# Patient Record
Sex: Female | Born: 1965 | Race: White | Hispanic: Yes | Marital: Married | Smoking: Never smoker
Health system: Southern US, Community
[De-identification: ages and names within clinical notes are randomized; demographics above are authoritative.]

## PROBLEM LIST (undated history)

## (undated) DIAGNOSIS — R7989 Other specified abnormal findings of blood chemistry: Secondary | ICD-10-CM

## (undated) DIAGNOSIS — R42 Dizziness and giddiness: Secondary | ICD-10-CM

## (undated) DIAGNOSIS — Z8619 Personal history of other infectious and parasitic diseases: Secondary | ICD-10-CM

## (undated) DIAGNOSIS — L989 Disorder of the skin and subcutaneous tissue, unspecified: Secondary | ICD-10-CM

## (undated) DIAGNOSIS — J302 Other seasonal allergic rhinitis: Secondary | ICD-10-CM

## (undated) DIAGNOSIS — R945 Abnormal results of liver function studies: Secondary | ICD-10-CM

## (undated) HISTORY — DX: Other seasonal allergic rhinitis: J30.2

## (undated) HISTORY — PX: PELVIC LAPAROSCOPY: SHX162

## (undated) HISTORY — DX: Disorder of the skin and subcutaneous tissue, unspecified: L98.9

## (undated) HISTORY — PX: GASTRIC BYPASS: SHX52

## (undated) HISTORY — DX: Abnormal results of liver function studies: R94.5

## (undated) HISTORY — DX: Dizziness and giddiness: R42

## (undated) HISTORY — DX: Personal history of other infectious and parasitic diseases: Z86.19

## (undated) HISTORY — DX: Morbid (severe) obesity due to excess calories: E66.01

## (undated) HISTORY — PX: OVARIAN CYST REMOVAL: SHX89

## (undated) HISTORY — PX: HERNIA REPAIR: SHX51

## (undated) HISTORY — DX: Other specified abnormal findings of blood chemistry: R79.89

---

## 1999-09-19 ENCOUNTER — Encounter (INDEPENDENT_AMBULATORY_CARE_PROVIDER_SITE_OTHER): Payer: Self-pay | Admitting: *Deleted

## 1999-09-19 LAB — CONVERTED CEMR LAB

## 1999-11-17 ENCOUNTER — Encounter: Admission: RE | Admit: 1999-11-17 | Discharge: 1999-11-17 | Payer: Self-pay | Admitting: Obstetrics

## 1999-11-24 ENCOUNTER — Ambulatory Visit (HOSPITAL_COMMUNITY): Admission: RE | Admit: 1999-11-24 | Discharge: 1999-11-24 | Payer: Self-pay | Admitting: Obstetrics

## 1999-11-24 ENCOUNTER — Encounter: Payer: Self-pay | Admitting: Obstetrics

## 1999-12-02 ENCOUNTER — Inpatient Hospital Stay (HOSPITAL_COMMUNITY): Admission: AD | Admit: 1999-12-02 | Discharge: 1999-12-02 | Payer: Self-pay | Admitting: Obstetrics & Gynecology

## 1999-12-02 ENCOUNTER — Ambulatory Visit (HOSPITAL_COMMUNITY): Admission: RE | Admit: 1999-12-02 | Discharge: 1999-12-02 | Payer: Self-pay | Admitting: Obstetrics

## 1999-12-15 ENCOUNTER — Encounter: Admission: RE | Admit: 1999-12-15 | Discharge: 1999-12-15 | Payer: Self-pay | Admitting: Obstetrics

## 1999-12-21 ENCOUNTER — Encounter: Admission: RE | Admit: 1999-12-21 | Discharge: 1999-12-21 | Payer: Self-pay | Admitting: Obstetrics

## 2000-04-02 ENCOUNTER — Encounter: Admission: RE | Admit: 2000-04-02 | Discharge: 2000-04-02 | Payer: Self-pay | Admitting: Family Medicine

## 2000-04-16 ENCOUNTER — Encounter: Admission: RE | Admit: 2000-04-16 | Discharge: 2000-04-16 | Payer: Self-pay | Admitting: Family Medicine

## 2000-05-29 ENCOUNTER — Encounter: Admission: RE | Admit: 2000-05-29 | Discharge: 2000-05-29 | Payer: Self-pay | Admitting: Family Medicine

## 2000-09-18 HISTORY — PX: INTRAUTERINE DEVICE INSERTION: SHX323

## 2000-12-10 ENCOUNTER — Encounter (INDEPENDENT_AMBULATORY_CARE_PROVIDER_SITE_OTHER): Payer: Self-pay | Admitting: Specialist

## 2000-12-10 ENCOUNTER — Ambulatory Visit (HOSPITAL_COMMUNITY): Admission: RE | Admit: 2000-12-10 | Discharge: 2000-12-10 | Payer: Self-pay | Admitting: Obstetrics and Gynecology

## 2001-03-26 ENCOUNTER — Other Ambulatory Visit: Admission: RE | Admit: 2001-03-26 | Discharge: 2001-03-26 | Payer: Self-pay | Admitting: Gynecology

## 2005-04-25 ENCOUNTER — Other Ambulatory Visit: Admission: RE | Admit: 2005-04-25 | Discharge: 2005-04-25 | Payer: Self-pay | Admitting: Family Medicine

## 2005-05-09 ENCOUNTER — Ambulatory Visit (HOSPITAL_COMMUNITY): Admission: RE | Admit: 2005-05-09 | Discharge: 2005-05-09 | Payer: Self-pay | Admitting: Gynecology

## 2006-04-30 ENCOUNTER — Other Ambulatory Visit: Admission: RE | Admit: 2006-04-30 | Discharge: 2006-04-30 | Payer: Self-pay | Admitting: Gynecology

## 2006-07-26 ENCOUNTER — Ambulatory Visit: Payer: Self-pay | Admitting: Family Medicine

## 2006-07-26 ENCOUNTER — Ambulatory Visit: Payer: Self-pay

## 2006-07-26 LAB — CONVERTED CEMR LAB
Chol/HDL Ratio, serum: 4.7
Cholesterol: 217 mg/dL (ref 0–200)
Glucose, Bld: 95 mg/dL (ref 70–99)
HDL: 45.7 mg/dL (ref >39.0)
LDL DIRECT: 139 mg/dL
Triglyceride fasting, serum: 135 mg/dL (ref 0–149)
VLDL: 27 mg/dL (ref 0–40)

## 2006-11-16 ENCOUNTER — Encounter (INDEPENDENT_AMBULATORY_CARE_PROVIDER_SITE_OTHER): Payer: Self-pay | Admitting: *Deleted

## 2007-05-09 ENCOUNTER — Other Ambulatory Visit: Admission: RE | Admit: 2007-05-09 | Discharge: 2007-05-09 | Payer: Self-pay | Admitting: Gynecology

## 2007-08-29 ENCOUNTER — Ambulatory Visit: Payer: Self-pay | Admitting: Family Medicine

## 2007-08-29 DIAGNOSIS — M79609 Pain in unspecified limb: Secondary | ICD-10-CM | POA: Insufficient documentation

## 2007-09-06 ENCOUNTER — Encounter (INDEPENDENT_AMBULATORY_CARE_PROVIDER_SITE_OTHER): Payer: Self-pay | Admitting: *Deleted

## 2011-02-03 NOTE — Op Note (Signed)
[facility]  Patient:    Jasmine Campbell, Jasmine Campbell                   MRN: [id number] Proc. Date: 12/10/00 Adm. Date:  [id number] Attending:  Tonye Royalty                           Operative Report  SURGEON:                      Gaetano Hawthorne. Lily Peer, M.D.  ASSISTANT:                    Katy Fitch, M.D.  INDICATIONS:                  This 45 year old gravida 1, para 1, with chronic right lower quadrant pain and persistent right ovarian cyst.  PREOPERATIVE DIAGNOSES:       1. Chronic pelvic pain.                               2. Persistent right ovarian cyst.  POSTOPERATIVE DIAGNOSES:      1. Chronic pelvic pain.                               2. Persistent right ovarian cyst.  ANESTHESIA:                   General endotracheal anesthesia.  PROCEDURE PERFORMED:          Laparoscopic right ovarian cystectomy.  FINDINGS:                     1. A 3.0 cm x 4.0 cm smooth-surfaced ovarian                               2. The contralateral ovary was normal.                               3. Normal tubes bilateral, and cul-de-sac                                  negative for endometriosis or adhesions.  DESCRIPTION OF PROCEDURE:     After the patient was adequately counseled, she was taken to the operating room where she had received 1 g of Cefotan for prophylaxis preoperatively.  She was placed in the low lithotomy position. The abdomen, vagina, and perineum were prepped and draped in the usual sterile fashion.  A Foley catheter was inserted.  A Hulka tenaculum was placed.  Keep in mind that there was an IUD still present, and was able to be introduced without any complication.  The drapes were then in place.  A small stab incision was made underneath the umbilicus, and with light blunt dissection the peritoneal cavity was entered.  Then the 10/11 trocar was inserted under visual inspection.  The CO2 was then connected, and the abdomen was  inflated to 3.5 L of carbon dioxide.  Two additional ports and 5 mm trocar were inserted approximately four finger breadths for the midline in the lower abdomen under laparoscopic visualization.  After the systematic inspection of the entire pelvic cavity, not demonstrating any other abnormality except for the above-mentioned cyst, a long needle was inserted and the ovarian cyst was aspirated and the cyst collapse.  The fluid was submitted for cytological evaluation, and the cyst wall was incised.  The ovarian capsule was entered, and the capsule was excised.  The ovarian cyst wall was then excised and passed off the operative field.  For hemostasis, some of the edges were cauterized with the bipolar Klepinger forceps.  The pelvic cavity was copiously irrigated with normal saline solution.  The instruments were removed under laparoscopic evaluation and under laparoscopic guidance, and no abnormality was noted.  The carbon dioxide was removed from the abdominal cavity.  The rectus fascia underneath the umbilicus was reapproximated with a running stitch of #3-0 Vicryl suture.  The skin was reapproximated with a subcuticular stitch of #4-0 plain catgut suture.  The two 5 mm trocars were reapproximated with interrupted sutures of #4-0 plain catgut suture for postoperative analgesia.  Marcaine 0.25% for a total of 10 cc was infiltrated in all three incision sites.  The Hulka tenaculum was removed, and the patient was awakened and transferred to the recovery room with stable vital signs. She received 30 mg of Toradol IV en route to the recovery room.  The blood loss was minimal.  The fluid resuscitation consisted of 1900 cc of lactated Ringers.  Urine output was 225 cc and clear.  Blood loss was minimal.   cyst. DD:  12/10/00 TD:  12/11/00 Job: [id number]

## 2011-02-03 NOTE — H&P (Signed)
[facility]  Patient:    Jasmine Campbell, Jasmine Campbell                   MRN: [id number] Adm. Date:  12/10/00 Attending:  Gaetano Hawthorne. Lily Peer, M.D.                         History and Physical  CHIEF COMPLAINT:              1. Chronic right lower quadrant pain.                               2. Persistent right ovarian cyst.  HISTORY OF PRESENT ILLNESS:   The patient is a 45 year old, gravida 1, para 1, with an IUD placed approximately four years ago in [place].  The patient was seen in our [facility] last March and was found to have a right ovarian cyst and was seen in the office the latter part of last year and was offered suppressive therapy versus proceeding with laparoscopic procedure, and they opted for Depo-Provera, so she received 150 mg IM and she returned back to the office on February 21 for followup ultrasound.  There was persistent right ovarian cyst that measured 4.4 x 4.0 x 3.1 cm.  The left ovary was normal and there was no fluid in the cul-de-sac.  The uterus was normal with an IUD found in normal position.  The patients work-up has included CA-125 which was normal.  She is being treated on a long-term treatment with Terazol for chronic moniliasis.  Due to some waking she had experienced in the past a random blood sugar was done in November of last year which had been reported to be normal.  She has also had a TSH which was also normal.  PAST MEDICAL HISTORY:         The patients mother is insulin-dependent diabetic and also hypertensive.  The patient with IUD as a form of contraception.  She has had one previous cesarean section.  PAST SURGICAL HISTORY:        She says she had some cyst removed from her ovaries in [place] in 1989.  She stated this was done at the time of her C-section, ?.  ALLERGIES:                    She denies any allergies.  CURRENT MEDICATIONS:          Terazol vaginal cream that she applies weekly for  chronic moniliasis.  PHYSICAL EXAMINATION:  GENERAL:                      Well-developed, well-nourished female with a weight of 182 pounds, 5 feet 2-1/2 inches tall.  HEENT:                        Unremarkable.  NECK:                         Supple.  Trachea midline.  No carotid bruits, no thyromegaly.  LUNGS:                        Clear to auscultation without rhonchi or wheezes.  HEART:  Regular rate and rhythm, no murmurs or gallops.  BREASTS:                      Breast examination done.  ABDOMEN:                      Soft, some tenderness in the right lower quadrant but no rebound or guarding.  PELVIC:                       Bartholins, urethral, and Skenes was within normal limits.  Vagina, cervix, no gross lesions on inspection.  IUD string visualized.  Uterus anteverted, upper limits of normal.  Adnexa:  Some fullness and slight tenderness right adnexa, left adnexa normal.  Rectal examination confirmed.  ASSESSMENT:                   A 45 year old, gravida 1, para 1 female with an intrauterine device in place with chronic right lower quadrant pain and persistent right ovarian cyst is scheduled to undergo diagnostic laparoscopy and right ovarian cystectomy, possible right oophorectomy, possible right salpingo-oophorectomy today at 1 oclock on December 10, 2000.  The risks and benefits of the procedure were explained to the patient and husband in Spanish to include infection, bleeding, trauma to internal organ, difficulty entrance to the abdominal cavity requiring open laparotomy and obtaining access to the pelvic cavity through that route which would require the patient to be hospitalized for several days, so she will receive antibiotics prophylaxis in the event of any hemorrhage.  She is aware that blood transfusions carry risks such as antephialtic reaction, hepatitis, and AIDS.  All these issues were discussed with the patient and her husband.   All questions were answered and we will follow accordingly.  PLAN:                         The patient is scheduled of diagnostic laparoscopy and right ovarian cystectomy for pelvic pain today March 25 at 1 p.m. at [facility].  Please have history and physical available. DD:  12/10/00 TD:  12/10/00 Job: [id number]

## 2011-07-03 ENCOUNTER — Encounter: Payer: Self-pay | Admitting: Anesthesiology

## 2011-07-06 ENCOUNTER — Ambulatory Visit: Payer: Self-pay | Admitting: Gynecology

## 2011-07-24 ENCOUNTER — Other Ambulatory Visit: Payer: Self-pay | Admitting: Gynecology

## 2011-07-24 ENCOUNTER — Ambulatory Visit (INDEPENDENT_AMBULATORY_CARE_PROVIDER_SITE_OTHER): Payer: 59 | Admitting: Gynecology

## 2011-07-24 ENCOUNTER — Encounter: Payer: Self-pay | Admitting: Gynecology

## 2011-07-24 ENCOUNTER — Other Ambulatory Visit (HOSPITAL_COMMUNITY)
Admission: RE | Admit: 2011-07-24 | Discharge: 2011-07-24 | Disposition: A | Discharge status: Home / Self Care | Payer: 59 | Source: Ambulatory Visit | Attending: Gynecology | Admitting: Gynecology

## 2011-07-24 VITALS — BP 128/82 | Ht 63.0 in | Wt 185.0 lb

## 2011-07-24 DIAGNOSIS — Z01419 Encounter for gynecological examination (general) (routine) without abnormal findings: Secondary | ICD-10-CM | POA: Insufficient documentation

## 2011-07-24 DIAGNOSIS — Z975 Presence of (intrauterine) contraceptive device: Secondary | ICD-10-CM

## 2011-07-24 DIAGNOSIS — R635 Abnormal weight gain: Secondary | ICD-10-CM

## 2011-07-24 DIAGNOSIS — Z1231 Encounter for screening mammogram for malignant neoplasm of breast: Secondary | ICD-10-CM

## 2011-07-24 DIAGNOSIS — R823 Hemoglobinuria: Secondary | ICD-10-CM

## 2011-07-24 DIAGNOSIS — Z833 Family history of diabetes mellitus: Secondary | ICD-10-CM

## 2011-07-24 DIAGNOSIS — IMO0001 Reserved for inherently not codable concepts without codable children: Secondary | ICD-10-CM

## 2011-07-24 LAB — HEMOGLOBIN A1C: Mean Plasma Glucose: 111 mg/dL (ref <117)

## 2011-07-24 NOTE — Progress Notes (Signed)
Jasmine Campbell [birth year] [id number]  Patient is a 45 year old gravida 1 para 1 who has not been seen in the office is 2008. Her last mammogram was in 2006 which was normal and she frequently does result this examination is having normal menstrual cycles and is overdue to have the ParaGard T380A IUD removed. She does have a strong family history of diabetes and is overweight. Patient is having normal menstrual cycles.   Past medical history,surgical history, family history and social history were all reviewed and documented in the EPIC chart.  Gynecologic History Patient's last menstrual period was 07/22/2011. Contraception: IUD Last Pap: 2008. Results were: normal Last mammogram: 2006. Results were:normal}  Obstetric History OB History    Grav Para Term Preterm Abortions TAB SAB Ect Mult Living   1 1 1       1      # Outc Date GA Lbr Len/2nd Wgt Sex Del Anes PTL Lv   1 TRM     M CS   Yes       ROS:  Was performed and pertinent positives and negatives are included in the history.  Exam: chaperone present  BP 128/82  Ht 5\' 3"  (1.6 m)  Wt 185 lb (83.915 kg)  BMI 32.77 kg/m2  LMP 07/22/2011  Body mass index is 32.77 kg/(m^2).  General appearance : Well developed well nourished female. No acute distress HEENT: Neck supple, trachea midline, no carotid bruits, no thyroidmegaly Lungs: Clear to auscultation, no rhonchi or wheezes, or rib retractions  Heart: Regular rate and rhythm, no murmurs or gallops Breast:Examined in sitting and supine position were symmetrical in appearance, no palpable masses or tenderness,  no skin retraction, no nipple inversion, no nipple discharge, no skin discoloration, no axillary or supraclavicular lymphadenopathy Abdomen: no palpable masses or tenderness, no rebound or guarding Extremities: no edema or skin discoloration or tenderness  Pelvic:  Bartholin, Urethra, Skene Glands: Within normal limits             Vagina: No gross lesions or  discharge  Cervix: No gross lesions or discharge IUD string seen  Uterus  anteverted, normal size, shape and consistency, non-tender and mobile  Adnexa  Without masses or tenderness  Anus and perineum  normal   Rectovaginal  normal sphincter tone without palpated masses or tenderness             Hemoccult not done     Assessment/Plan:  45 y.o. female for annual exam which was unremarkable. Patient was scheduled appointment next week to remove her IUD in place a new ParaGard T380A IUD. The following labs were ordered today CBC, cholesterol, hemoglobin A1c, TSH, urinalysis. We'll discuss the results when she returned to the office next week. Pap smear was done today. She is instructed take calcium and vitamin D for osteoporosis prevention.

## 2011-07-24 NOTE — Patient Instructions (Signed)
Tomar calcium con vitamina D una tableta de Caltrate Plus, o Oscal diaria

## 2011-07-24 NOTE — Progress Notes (Signed)
Addended byCammie Mcgee T on: 07/24/2011 11:49 AM   Modules accepted: Orders

## 2011-07-25 ENCOUNTER — Telehealth: Payer: Self-pay | Admitting: *Deleted

## 2011-07-25 NOTE — Telephone Encounter (Signed)
Message copied by Libby Maw on Tue Jul 25, 2011  9:18 AM ------      Message from: Reynaldo Minium H      Created: Mon Jul 24, 2011 11:31 AM       Ladene Allocca, please order the ParaGard T380A IUD for this patient. She has been an appointment to see me next week to remove her old 1 so we can put in a new one. Please call her to confirm that is covered by her insurance and her days been scheduled for next week.

## 2011-07-25 NOTE — Telephone Encounter (Signed)
Patient was informed by [facility] CMA benefits for Paraguard.  Patient would be responsible for full cost of IUD, removal, and insert.  Her insurance has a $1500 deductable that has to be met before it pays 70%.  Her portion would be $1135.00.  Patient told Elane Fritz CMA that she would check further with insurance and if she does not go with IUD, would want to discuss further options with JF.

## 2011-08-01 ENCOUNTER — Ambulatory Visit: Payer: 59 | Admitting: Gynecology

## 2011-08-21 ENCOUNTER — Ambulatory Visit (HOSPITAL_COMMUNITY)
Admission: RE | Admit: 2011-08-21 | Discharge: 2011-08-21 | Disposition: A | Discharge status: Home / Self Care | Payer: 59 | Source: Ambulatory Visit | Attending: Gynecology | Admitting: Gynecology

## 2011-08-21 DIAGNOSIS — Z1231 Encounter for screening mammogram for malignant neoplasm of breast: Secondary | ICD-10-CM | POA: Insufficient documentation

## 2012-05-04 ENCOUNTER — Ambulatory Visit (INDEPENDENT_AMBULATORY_CARE_PROVIDER_SITE_OTHER): Payer: 59 | Admitting: Internal Medicine

## 2012-05-04 VITALS — BP 125/82 | HR 96 | Temp 98.5°F | Resp 18 | Ht 63.0 in | Wt 188.6 lb

## 2012-05-04 DIAGNOSIS — J40 Bronchitis, not specified as acute or chronic: Secondary | ICD-10-CM

## 2012-05-04 MED ORDER — HYDROCODONE-ACETAMINOPHEN 7.5-500 MG/15ML PO SOLN: 5.0000 mL | Freq: Four times a day (QID) | ORAL | Status: AC | PRN

## 2012-05-04 MED ORDER — AZITHROMYCIN 500 MG PO TABS: 500.0000 mg | ORAL_TABLET | Freq: Every day | ORAL | Status: AC

## 2012-05-04 NOTE — Progress Notes (Signed)
  Subjective:    Patient ID: Jasmine Campbell, female    DOB: June 27, 1966, 46 y.o.   MRN: [id number]  HPI Has cough and st, no sob, cp. General health is good.   Review of Systems     Objective:   Physical Exam  Constitutional: She is oriented to person, place, and time. She appears well-developed and well-nourished.  HENT:  Right Ear: External ear normal.  Left Ear: External ear normal.  Mouth/Throat: Oropharyngeal exudate present.  Eyes: EOM are normal. Pupils are equal, round, and reactive to light.  Neck: Normal range of motion. Neck supple.  Cardiovascular: Normal rate, regular rhythm and normal heart sounds.   Pulmonary/Chest: Effort normal. Not tachypneic. No respiratory distress. She has no decreased breath sounds. She has rhonchi.  Lymphadenopathy:    She has cervical adenopathy.  Neurological: She is alert and oriented to person, place, and time.  Skin: Skin is warm and dry.  Psychiatric: She has a normal mood and affect.          Assessment & Plan:  Bronchitis

## 2012-05-04 NOTE — Patient Instructions (Addendum)
Faringitis (viral y [place]) (Pharyngitis, Viral and Bacterial) Es una inflamacin (irritacin) o infeccin de la faringe. Tambin se denomina dolor de garganta.  CAUSAS La mayor parte de las anginas son causadas por virus y son parte de un resfro. Sin embargo, algunas anginas son ocasionadas por la bacteria estreptococo (germen) o por otras bacterias. La causa de la angina tambin puede ser el goteo nasal proveniente de los senos [place], [place] y, en algunos [place], se produce incluso por dormir con la boca abierta. Las anginas infecciosas pueden contagiarse de [place]om[place] Dear persona a otra por la tos, el estornudo y por compartir tasas o cubiertos. TRATAMIENTO Las anginas de causa viral generalmente duran entre 3 y [address] Estas infecciones virales generalmente mejoran sin antibiticos (medicamentos que destruyen grmenes). Las anginas por estreptococo y otras bacterias (grmenes) generalmente mejoran despus de 24 a 48 horas de Microbiologist con antibiticos. INSTRUCCIONES PARA EL CUIDADO DOMICILIARIO  Si en profesional considera que se trata de una infeccin bacteriana o si hay una prueba positiva para Event organiser, Administrator, sports un antibitico. Debe tomar todos los antibiticos Librarian, academic. Si no completa el tratamiento con antibiticos, usted o su hijo podrn enfermar nuevamente. Si usted o su hijo presentan un estreptococo en la garganta y no completan el tratamiento, podran sufrir un trastorno grave en el corazn o en los riones.   Beba gran cantidad de lquidos. Alrededor de 8-10 vasos grandes de agua por da. (Puede ser agua, jugos, licuados de frutas, Kool-aid, [place], [place], etc.).   Utilice los medicamentos de venta libre o de prescripcin para Chief Technology Officer, Environmental health practitioner o la [place], segn se lo indique el profesional que lo asiste.   Descanse lo suficiente.   Hgase grgaras con agua salada (1/2 cucharadita de sal en un vaso de agua) cada 1-2 horas si  lo necesita para [employer].   Si el paciente es mayor de [place], ofrzcale caramelos duros o Engineer, manufacturing o pastillas para la tos.  SOLICITE ATENCIN MDICA SI:  Si le aparecen bultos grandes y dolorosos en el cuello.   Presenta una erupcin.   Elimina un esputo verde, marrn-amarillento o sanguinolento.   El beb tiene ms de 3 meses y su temperatura rectal es de 100.5 F (38.1 C) o ms durante ms de 1 da.  SOLICITE ATENCIN MDICA DE INMEDIATO SI:  Siente rigidez en el cuello.   Usted o su hijo babean o no pueden tragar lquidos.   Usted o su hijo vomitan, no pueden retener los [employer].   Usted o su hijo presentan dolor intenso, que no se alivia con los [employer] Health han recetado.   Usted o su hijo presentan dificultad para respirar (y no se debe a la [place]).   Usted o su hijo no pueden abrir Scientist, product/process development.   Usted o su nio presentan aumento del dolor, hinchazn, enrojecimiento en el cuello.   Tiene fiebre.   Su beb tiene ms de 3 meses y su temperatura rectal es de 102 F (38.9 C) o ms.   Su beb tiene 3 meses o menos y su temperatura rectal es de 100.4 F (38 C) o ms.  EST SEGURO QUE:   Comprende las instrucciones para el alta mdica.   Controlar su enfermedad.   Solicitar atencin mdica de inmediato segn las indicaciones.  Document Released: 06/14/2005 Document Revised: 08/24/2011 [facility] Patient Information 2012 [place], [place].Bronquitis (Bronchitis) La bronquitis es [place] inflamacin (el modo que tiene [place]  organismo de Publishing rights manager a una lesin o infeccin) de los bronquios Los bronquios son los conductos que se extienden desde la trquea [employer]. Si la inflamacin se agrava, puede causar la falta de aire. CAUSAS Las causas de la inflamacin pueden ser:  Un virus   Grmenes (bacteria).   Polvo   Alergenos   La polucin y muchos otros irritantes  Las clulas que revisten el  rbol bronquial estn cubiertas con pequeos pelos (cilias). Esta constantemente producen un movimiento desde los pulmones hacia la boca. De este modo se mantienen los pulmones libres de polucin. Cuando estas clulas se irritan y no pueden cumplir su funcin, comienza a formarse la mucosidad. Esto produce la caracterstica tos de la bronquitis. La tos es el mecanismo por el cual se limpian los pulmones cuando las cilias no pueden cumplir su funcin. Sin alguno de [employer], Animator se Psychologist, clinical los pulmones Entonces se desarrollara una pulmona.  El fumar es una de las causas ms frecuentes de bronquitis y puede contribuir a la neumona. Abandonar este hbito es lo ms importante que puede hacer para beneficiarse. TRATAMIENTO  El [employer] prescribir antibiticos si la causa es una bacteria, y medicamentos para abrir las vas areas y Personnel officer. Tambin puede recomendar o prescribir un expectorante. El expectorante aflojar la mucosidad para que pueda eliminarla. Slo tome medicamentos de Sales promotion account executive o prescriptos para Primary school teacher, las [place], o bajar la fiebre segn las indicaciones de su mdico.   Pharmacologist todo lo que causa el problema (por ejemplo el hbito de Art therapist) es fundamental para evitar que empeore.   Un antitusgeno puede prescribirse para [employer] de la tos.   Podrn indicarle inhalantes para aliviar los sntomas actuales y ayudar a prevenir problemas futuros.   Aquellos que sufren bronquitis crnica (recurrente) puede ser necesaria la administracin de corticoides.  SOLICITE ATENCIN MDICA INMEDIATAMENTE SI:  Durante el tratamiento observa que elimina esputo similar a pus (purulento).   Tiene fiebre.   Su beb tiene ms de 3 meses y su temperatura rectal es de 102 F (38.9 C) o ms.   Su beb tiene 3 meses o menos y su temperatura rectal es de 100.4 F (38 C) o ms.   Se siente cada vez ms enfermo.   Tiene cada  vez ms dificultad para respirar, tiene ruidos al respirar o Company secretary.  Es necesario buscar atencin mdica inmediata si es [place] persona de edad avanzada o sufre alguna otra enfermedad. ASEGURESE DE QUE:   Comprende estas instrucciones.   Controlar su enfermedad.   Solicitar ayuda inmediatamente si no mejora o si empeora.  Document Released: 09/04/2005 Document Revised: 08/24/2011 [facility] Patient Information 2012 [place], [place].

## 2012-07-26 ENCOUNTER — Encounter: Payer: Self-pay | Admitting: Family Medicine

## 2012-07-26 ENCOUNTER — Ambulatory Visit (INDEPENDENT_AMBULATORY_CARE_PROVIDER_SITE_OTHER): Payer: 59 | Admitting: Family Medicine

## 2012-07-26 VITALS — BP 114/68 | HR 75 | Temp 98.3°F | Resp 16 | Ht 63.0 in | Wt 200.6 lb

## 2012-07-26 DIAGNOSIS — Z0000 Encounter for general adult medical examination without abnormal findings: Secondary | ICD-10-CM

## 2012-07-26 LAB — POCT CBC
Granulocyte percent: 64.1 % (ref 37–80)
HCT, POC: 47.3 % (ref 37.7–47.9)
Hemoglobin: 14.9 g/dL (ref 12.2–16.2)
Lymph, poc: 2 (ref 0.6–3.4)
MCH, POC: 28.2 pg (ref 27–31.2)
MCHC: 31.5 g/dL — AB (ref 31.8–35.4)
MCV: 89.4 fL (ref 80–97)
MID (cbc): 0.4 (ref 0–0.9)
MPV: 9 fL (ref 0–99.8)
POC Granulocyte: 4.3 (ref 2–6.9)
POC LYMPH PERCENT: 29.7 %L (ref 10–50)
POC MID %: 6.2 % (ref 0–12)
Platelet Count, POC: 341 10*3/uL (ref 142–424)
RBC: 5.29 M/uL (ref 4.04–5.48)
RDW, POC: 13.8 %
WBC: 6.7 10*3/uL (ref 4.6–10.2)

## 2012-07-26 LAB — POCT URINALYSIS DIPSTICK
Bilirubin, UA: NEGATIVE
Glucose, UA: NEGATIVE
Ketones, UA: NEGATIVE
Nitrite, UA: NEGATIVE
Protein, UA: NEGATIVE
Spec Grav, UA: <=1.005
Urobilinogen, UA: 0.2
pH, UA: 5

## 2012-07-26 LAB — COMPREHENSIVE METABOLIC PANEL WITH GFR
ALT: 71 U/L — ABNORMAL HIGH (ref 0–35)
Albumin: 4.1 g/dL (ref 3.5–5.2)
CO2: 29 meq/L (ref 19–32)
Calcium: 9.6 mg/dL (ref 8.4–10.5)
Chloride: 101 meq/L (ref 96–112)
Glucose, Bld: 74 mg/dL (ref 70–99)
Potassium: 4.2 meq/L (ref 3.5–5.3)
Sodium: 137 meq/L (ref 135–145)
Total Bilirubin: 0.6 mg/dL (ref 0.3–1.2)
Total Protein: 7 g/dL (ref 6.0–8.3)

## 2012-07-26 LAB — COMPREHENSIVE METABOLIC PANEL
AST: 34 U/L (ref 0–37)
Alkaline Phosphatase: 80 U/L (ref 39–117)
BUN: 10 mg/dL (ref 6–23)
Creat: 0.82 mg/dL (ref 0.50–1.10)

## 2012-07-26 LAB — POCT UA - MICROSCOPIC ONLY
Casts, Ur, LPF, POC: NEGATIVE
Crystals, Ur, HPF, POC: NEGATIVE
Mucus, UA: NEGATIVE
Yeast, UA: NEGATIVE

## 2012-07-26 LAB — LIPID PANEL
Cholesterol: 201 mg/dL — ABNORMAL HIGH (ref 0–200)
HDL: 51 mg/dL (ref >39)
LDL Cholesterol: 116 mg/dL — ABNORMAL HIGH (ref 0–99)
Total CHOL/HDL Ratio: 3.9 Ratio
Triglycerides: 171 mg/dL — ABNORMAL HIGH (ref <150)
VLDL: 34 mg/dL (ref 0–40)

## 2012-07-26 LAB — TSH: TSH: 2.886 u[IU]/mL (ref 0.350–4.500)

## 2012-07-26 LAB — POCT GLYCOSYLATED HEMOGLOBIN (HGB A1C): Hemoglobin A1C: 5.7

## 2012-07-26 NOTE — Progress Notes (Signed)
[facility]:  Office Visit  Chief Complaint:  Chief Complaint  Patient presents with  . Annual Exam    for work    HPI: Jasmine Campbell is a 46 y.o. female who complains of  Annual without pap. She has been doing well, has had no abnormal paps or mammogram, however she does not get one every year, last mammo and pap were in 07/24/2011 which were both normal. She is here today for a physical through work. 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 She has been gaining weight but thinks it has to do with her diet. She is drinking vegetable shakes in the morning but not really changing the way she eats or exercises.   History reviewed. No pertinent past medical history. Past Surgical History  Procedure Date  . Cesarean section     OVARIAN CYSTECTOMY  . Pelvic laparoscopy     RIGHT OVARIAN CYSTECTOMY  . Intrauterine device insertion 2002    PARAGUARD T-380   History   Social History  . Marital Status: Legally Separated    Spouse Name: N/A    Number of Children: N/A  . Years of Education: N/A   Social History Main Topics  . Smoking status: Never Smoker   . Smokeless tobacco: Never Used  . Alcohol Use: No     Comment: occasional  . Drug Use: No  . Sexually Active: Yes -- Female partner(s)    Birth Control/ Protection: IUD   Other Topics Concern  . None   Social History Narrative  . None   Family History  Problem Relation Age of Onset  . Diabetes Mother     DIAGNOSED ON 2005  . Hypertension Mother    No Known Allergies Prior to Admission medications   Medication Sig Start Date End Date Taking? Authorizing Provider  Multiple Vitamin (MULTIVITAMIN) capsule Take 1 capsule by mouth daily.     Yes Historical Provider, MD     ROS: The patient denies fevers, chills, night sweats, unintentional weight loss, chest pain, palpitations, wheezing, dyspnea on exertion, nausea, vomiting, abdominal pain, dysuria, hematuria, melena, numbness,  weakness, or tingling.   All other systems have been reviewed and were otherwise negative with the exception of those mentioned in the HPI and as above.    PHYSICAL EXAM: Filed Vitals:   07/26/12 1034  BP: 114/68  Pulse: 75  Temp: 98.3 F (36.8 C)  Resp: 16   Filed Vitals:   07/26/12 1034  Height: 5\' 3"  (1.6 m)  Weight: 200 lb 9.6 oz (90.992 kg)   Body mass index is 35.53 kg/(m^2).  General: Alert, no acute distress, obese HEENT:  Normocephalic, atraumatic, oropharynx patent. EOMI, PERRLA, fundoscopic exam nl Cardiovascular:  Regular rate and rhythm, no rubs murmurs or gallops.  No Carotid bruits, radial pulse intact. No pedal edema.  Respiratory: Clear to auscultation bilaterally.  No wheezes, rales, or rhonchi.  No cyanosis, no use of accessory musculature GI: No organomegaly, abdomen is soft and non-tender, positive bowel sounds.  No masses. Skin: No rashes. Neurologic: Facial musculature symmetric. Psychiatric: Patient is appropriate throughout our interaction. Lymphatic: No cervical lymphadenopathy Musculoskeletal: Gait intact.   LABS: Results for orders placed in visit on 07/24/11  HEMOGLOBIN A1C      Component Value Range   Hemoglobin A1C 5.5  <5.7 %   Mean Plasma Glucose 111  <117 mg/dL     EKG/XRAY:   Primary read interpreted by [facility]. Conley Rolls at [facility].   ASSESSMENT/PLAN:  Encounter Diagnosis  Name Primary?  . Annual physical exam Yes   Patient doing well overall Will return to get labs at 3 pm Labs pending: CBC, UA, CMP, fasting lipid, TSH, HbA1C     Russell Quinney PHUONG, DO 07/26/2012 11:04 AM

## 2012-08-13 ENCOUNTER — Encounter: Payer: Self-pay | Admitting: Family Medicine

## 2012-09-20 ENCOUNTER — Ambulatory Visit (INDEPENDENT_AMBULATORY_CARE_PROVIDER_SITE_OTHER): Payer: 59 | Admitting: Physician Assistant

## 2012-09-20 VITALS — BP 118/78 | HR 78 | Temp 98.1°F | Resp 17 | Ht 63.75 in | Wt 201.2 lb

## 2012-09-20 DIAGNOSIS — J329 Chronic sinusitis, unspecified: Secondary | ICD-10-CM

## 2012-09-20 DIAGNOSIS — R300 Dysuria: Secondary | ICD-10-CM

## 2012-09-20 DIAGNOSIS — R05 Cough: Secondary | ICD-10-CM

## 2012-09-20 LAB — POCT URINALYSIS DIPSTICK
Bilirubin, UA: NEGATIVE
Leukocytes, UA: NEGATIVE
Nitrite, UA: NEGATIVE
Protein, UA: NEGATIVE
Urobilinogen, UA: 0.2
pH, UA: 5.5

## 2012-09-20 LAB — POCT UA - MICROSCOPIC ONLY
Casts, Ur, LPF, POC: NEGATIVE
WBC, Ur, HPF, POC: NEGATIVE
Yeast, UA: NEGATIVE

## 2012-09-20 MED ORDER — IPRATROPIUM BROMIDE 0.03 % NA SOLN: 2.0000 | Freq: Two times a day (BID) | NASAL | Status: DC

## 2012-09-20 MED ORDER — GUAIFENESIN ER 1200 MG PO TB12: 1.0000 | ORAL_TABLET | Freq: Two times a day (BID) | ORAL | Status: DC | PRN

## 2012-09-20 MED ORDER — HYDROCOD POLST-CHLORPHEN POLST 10-8 MG/5ML PO LQCR: 5.0000 mL | Freq: Two times a day (BID) | ORAL | Status: DC | PRN

## 2012-09-20 MED ORDER — AMOXICILLIN-POT CLAVULANATE 875-125 MG PO TABS: 1.0000 | ORAL_TABLET | Freq: Two times a day (BID) | ORAL | Status: DC

## 2012-09-20 NOTE — Patient Instructions (Signed)
Get plenty of rest and drink at least 64 ounces of water daily.  If the discomfort when you urinate continues, please return for additional evaluation.

## 2012-09-20 NOTE — Progress Notes (Signed)
Subjective:    Patient ID: Jasmine Campbell, female    DOB: [birth year], 47 y.o.   MRN: [id number]  HPI This 47 y.o. female presents for evaluation of 2 days of illness.  She describes a cough that causes pain in her chest and urine leakage, LEFT-sided HA and lots of nasal congestion.  Her throat is scratchy.  No fever, chills.  No nausea, vomiting.  Some diarrhea yesterday.  No myalgias/arthralgias.  She complains of burning with urination, but no urgency or frequency.   History reviewed. No pertinent past medical history.  Past Surgical History  Procedure Date  . Cesarean section     OVARIAN CYSTECTOMY  . Pelvic laparoscopy     RIGHT OVARIAN CYSTECTOMY  . Intrauterine device insertion 2002    PARAGUARD T-380    Prior to Admission medications   Medication Sig Start Date End Date Taking? Authorizing Provider  Multiple Vitamin (MULTIVITAMIN) capsule Take 1 capsule by mouth daily.      Historical Provider, MD    No Known Allergies  History   Social History  . Marital Status: Legally Separated    Number of Children: 1  . Years of Education: N/A   Occupational History  . Housekeeping [employer]   Social History Main Topics  . Smoking status: Never Smoker   . Smokeless tobacco: Never Used  . Alcohol Use: No     Comment: occasional  . Drug Use: No  . Sexually Active: Yes -- Female partner(s)    Birth Control/ Protection: IUD   Social History Narrative   Her adult son lives in [place]. She is from [place], came to the [place] in 2000.    Family History  Problem Relation Age of Onset  . Diabetes Mother     DIAGNOSED ON 2005  . Hypertension Mother      Review of Systems As above.    Objective:   Physical Exam Blood pressure 118/78, pulse 78, temperature 98.1 F (36.7 C), temperature source Oral, resp. rate 17, height 5' 3.75" (1.619 m), weight 201 lb 3.2 oz (91.264 kg), last menstrual period 09/13/2012, SpO2 99.00%. Body mass index is 34.81  kg/(m^2). Well-developed, well nourished [place] woman who is awake, alert and oriented, in NAD. HEENT: [place]/AT, PERRL, EOMI.  Sclera and conjunctiva are clear.  EAC are patent, TMs are normal in appearance. Nasal mucosa is very congested, pink and moist. OP is clear. LEFT frontal sinus tenderness, bilateral maxillary sinus tenderness. Neck: supple, non-tender, no lymphadenopathy, thyromegaly. Heart: RRR, no murmur Lungs: normal effort, CTA Extremities: no cyanosis, clubbing or edema. Skin: warm and dry without rash. Psychologic: good mood and appropriate affect, normal speech and behavior.  Results for orders placed in visit on 09/20/12  POCT UA - MICROSCOPIC ONLY      Component Value Range   WBC, Ur, HPF, POC neg     RBC, urine, microscopic 0-1     Bacteria, U Microscopic trace     Mucus, UA neg     Epithelial cells, urine per micros 3-5     Crystals, Ur, HPF, POC neg     Casts, Ur, LPF, POC neg     Yeast, UA neg    POCT URINALYSIS DIPSTICK      Component Value Range   Color, UA yellow     Clarity, UA clear     Glucose, UA neg     Bilirubin, UA neg     Ketones, UA neg     Spec Grav, UA 1.020  Blood, UA neg     pH, UA 5.5     Protein, UA neg     Urobilinogen, UA 0.2     Nitrite, UA neg     Leukocytes, UA Negative        Assessment & Plan:   1. Sinusitis  amoxicillin-clavulanate (AUGMENTIN) 875-125 MG per tablet, ipratropium (ATROVENT) 0.03 % nasal spray, Guaifenesin (MUCINEX MAXIMUM STRENGTH) 1200 MG TB12  2. Cough  chlorpheniramine-HYDROcodone (TUSSIONEX PENNKINETIC ER) 10-8 MG/5ML LQCR  3. Dysuria  POCT UA - Microscopic Only, POCT urinalysis dipstick   Supportive care.  Anticipatory guidance.  RTC if urinary symptoms persist.

## 2013-11-24 ENCOUNTER — Ambulatory Visit (INDEPENDENT_AMBULATORY_CARE_PROVIDER_SITE_OTHER): Payer: 59 | Admitting: Gynecology

## 2013-11-24 ENCOUNTER — Encounter: Payer: Self-pay | Admitting: Gynecology

## 2013-11-24 ENCOUNTER — Other Ambulatory Visit (HOSPITAL_COMMUNITY)
Admission: RE | Admit: 2013-11-24 | Discharge: 2013-11-24 | Disposition: A | Discharge status: Home / Self Care | Payer: 59 | Source: Ambulatory Visit | Attending: Gynecology | Admitting: Gynecology

## 2013-11-24 VITALS — BP 124/80 | Ht 63.0 in | Wt 198.4 lb

## 2013-11-24 DIAGNOSIS — Z833 Family history of diabetes mellitus: Secondary | ICD-10-CM | POA: Insufficient documentation

## 2013-11-24 DIAGNOSIS — Z975 Presence of (intrauterine) contraceptive device: Secondary | ICD-10-CM | POA: Insufficient documentation

## 2013-11-24 DIAGNOSIS — Z01419 Encounter for gynecological examination (general) (routine) without abnormal findings: Secondary | ICD-10-CM | POA: Insufficient documentation

## 2013-11-24 DIAGNOSIS — R635 Abnormal weight gain: Secondary | ICD-10-CM

## 2013-11-24 DIAGNOSIS — K760 Fatty (change of) liver, not elsewhere classified: Secondary | ICD-10-CM | POA: Insufficient documentation

## 2013-11-24 DIAGNOSIS — E785 Hyperlipidemia, unspecified: Secondary | ICD-10-CM

## 2013-11-24 DIAGNOSIS — K7689 Other specified diseases of liver: Secondary | ICD-10-CM

## 2013-11-24 DIAGNOSIS — Z1151 Encounter for screening for human papillomavirus (HPV): Secondary | ICD-10-CM | POA: Insufficient documentation

## 2013-11-24 LAB — COMPREHENSIVE METABOLIC PANEL
ALK PHOS: 81 U/L (ref 39–117)
ALT: 81 U/L — AB (ref 0–35)
AST: 37 U/L (ref 0–37)
Albumin: 4.3 g/dL (ref 3.5–5.2)
BILIRUBIN TOTAL: 0.6 mg/dL (ref 0.2–1.2)
BUN: 12 mg/dL (ref 6–23)
CO2: 27 mEq/L (ref 19–32)
CREATININE: 0.81 mg/dL (ref 0.50–1.10)
Calcium: 9.3 mg/dL (ref 8.4–10.5)
Chloride: 104 mEq/L (ref 96–112)
Glucose, Bld: 110 mg/dL — ABNORMAL HIGH (ref 70–99)
Potassium: 4.4 mEq/L (ref 3.5–5.3)
SODIUM: 140 meq/L (ref 135–145)
TOTAL PROTEIN: 6.7 g/dL (ref 6.0–8.3)

## 2013-11-24 LAB — CBC WITH DIFFERENTIAL/PLATELET
BASOS ABS: 0 10*3/uL (ref 0.0–0.1)
BASOS PCT: 0 % (ref 0–1)
EOS ABS: 0.1 10*3/uL (ref 0.0–0.7)
EOS PCT: 2 % (ref 0–5)
HCT: 44.6 % (ref 36.0–46.0)
Hemoglobin: 15 g/dL (ref 12.0–15.0)
Lymphocytes Relative: 23 % (ref 12–46)
Lymphs Abs: 1.4 10*3/uL (ref 0.7–4.0)
MCH: 28.4 pg (ref 26.0–34.0)
MCHC: 33.6 g/dL (ref 30.0–36.0)
MCV: 84.5 fL (ref 78.0–100.0)
Monocytes Absolute: 0.3 10*3/uL (ref 0.1–1.0)
Monocytes Relative: 5 % (ref 3–12)
NEUTROS PCT: 70 % (ref 43–77)
Neutro Abs: 4.2 10*3/uL (ref 1.7–7.7)
PLATELETS: 290 10*3/uL (ref 150–400)
RBC: 5.28 MIL/uL — ABNORMAL HIGH (ref 3.87–5.11)
RDW: 13.5 % (ref 11.5–15.5)
WBC: 6 10*3/uL (ref 4.0–10.5)

## 2013-11-24 LAB — LIPID PANEL
CHOL/HDL RATIO: 3.9 ratio
Cholesterol: 195 mg/dL (ref 0–200)
HDL: 50 mg/dL (ref >39)
LDL Cholesterol: 99 mg/dL (ref 0–99)
Triglycerides: 228 mg/dL — ABNORMAL HIGH (ref <150)
VLDL: 46 mg/dL — AB (ref 0–40)

## 2013-11-24 LAB — TSH: TSH: 3.444 u[IU]/mL (ref 0.350–4.500)

## 2013-11-24 NOTE — Patient Instructions (Signed)
Control del colesterol  Los niveles de colesterol en el organismo estn determinados significativamente por su dieta. Los niveles de colesterol tambin se relacionan con la enfermedad cardaca. El material que sigue ayuda a explicar esta relacin y a analizar qu puede hacer para mantener su corazn sano. No todo el colesterol es malo. Las lipoprotenas de baja densidad (LDL) forman el colesterol "malo". El colesterol malo puede ocasionar depsitos de grasa que se acumulan en el interior de las arterias. Las lipoprotenas de alta densidad (HDL) es el colesterol "bueno". Ayuda a remover el colesterol LDL "malo" de la sangre. El colesterol es un factor de riesgo muy importante para la enfermedad cardaca. Otros factores de riesgo son la hipertensin arterial, el hbito de fumar, el estrs, la herencia y el peso.   El msculo cardaco obtiene el suministro de sangre a travs de las arterias coronarias. Si su colesterol LDL ("malo") est elevado y el HDL ("bueno") es bajo, tiene un factor de riesgo para que se formen depsitos de grasa en las arterias coronarias (los vasos sanguneos que suministran sangre al corazn). Esto hace que haya menos lugar para que la sangre circule. Sin la suficiente sangre y oxgeno, el msculo cardaco no puede funcionar correctamente, y usted podr sentir dolores en el pecho (angina pectoris). Cuando una arteria coronaria se cierra completamente, una parte del msculo cardaco puede morir (infarto de miocardio).  CONTROL DEL COLESTEROL Cuando el profesional que lo asiste enva la sangre al laboratorio para conocer el nivel de colesterol, puede realizarle tambin un perfil completo de los lpidos. Con esta prueba, se puede determinar la cantidad total de colesterol, as como los niveles de LDL y HDL. Los triglicridos son un tipo de grasa que circula en la sangre y que tambin puede utilizarse para determinar el riesgo de enfermedad  cardaca. En la siguiente tabla se establecen los nmeros ideales: Prueba: Colesterol total  Menos de 200 mg/dl.  Prueba: LDL "colesterol malo"  Menos de 100 mg/dl.   Menos de 70 mg/dl si tiene riesgo muy elevado de sufrir un ataque cardaco o muerte cardaca sbita.  Prueba: HDL "colesterol bueno"  Mujeres: Ms de 50 mg/dl.   Hombres: Ms de 40 mg/dl.  Prueba: Trigliceridos  Menos de 150 mg/dl.    CONTROL DEL COLESTEROL CON DIETA Aunque factores como el ejercicio y el estilo de vida son importantes, la "primera lnea de ataque" es la dieta. Esto se debe a que se sabe que ciertos alimentos hacen subir el colesterol y otros lo bajan. El objetivo debe ser equilibrar los alimentos, de modo que tengan un efecto sobre el colesterol y, an ms importante, reemplazar las grasas saturadas y trans con otros tipos de grasas, como las monoinsaturadas y las poliinsaturadas y cidos grasos omega-3 . En promedio, una persona no debe consumir ms de 15 a 17 g de grasas saturadas por da. Las grasas saturadas y trans se consideran grasas "malas", ya que elevan el colesterol LDL. Las grasas saturadas se encuentran principalmente en productos animales como carne, manteca y crema. Pero esto no significa que usted debe sacrificar todas sus comidas favoritas. Actualmente, como lo muestra el cuadro que figura al final de este documento, hay sustitutos de buen sabor, bajos en grasas y en colesterol, para la mayora de los alimentos que a usted le gusta comer. Elija aquellos alimentos alternativos que sean bajos en grasas o sin grasas. Elija cortes de carne del cuarto trasero o lomo ya que estos cortes son los que tienen menor cantidad de grasa   y colesterol. El pollo (sin piel), el pescado, la carne de ternera, y la pechuga de pavo molida son excelentes opciones. Elimine las carnes grasosas como los hotdogs o el salami. Los mariscos tienen poco o nada de grasas saturadas. Cuando consuma carne magra, carne de aves de  corral, o pescado, hgalo en porciones de 85 gramos (3 onzas). Las grasas trans tambin se llaman "aceites parcialmente hidrogenados". Son aceites manipulados cientficamente de modo que son slidos a temperatura ambiente, tienen una larga vida y mejoran el sabor y la textura de los alimentos a los que se agregan. Las grasas trans se encuentran en la margarina, masitas, crackers y alimentos horneados.  Para hornear y cocinar, el aceite es un excelente sustituto para la mantequilla. Los aceites monoinsaturados tienen un beneficio particular, ya que se cree que disminuyen el colesterol LDL (colesterol malo) y elevan el HDL. Deber evitar los aceites tropicales saturados como el de coco y el de palma.  Recuerde, adems, que puede comer sin restricciones los grupos de alimentos que son naturalmente libres de grasas saturadas y grasas trans, entre los que se incluyen el pescado, las frutas (excepto el aguacate), verduras, frijoles, cereales (cebada, arroz, cuzcuz, trigo) y las pastas (sin salsas con crema)   IDENTIFIQUE LOS ALIMENTOS QUE DISMINUYEN EL COLESTEROL  Pueden disminuir el colesterol las fibras solubles que estn en las frutas, como las manzanas, en los vegetales como el brcoli, las patatas y las zanahorias; en las legumbres como frijoles, guisantes y lentejas; y en los cereales como la cebada. Los alimentos fortificados con fitosteroles tambin pueden disminuir el colesterol. Debe consumir al menos 2 g de estos alimentos a diario para obtener el efecto de disminucin de colesterol.  En el supermercado, lea las etiquetas de los envases para identificar los alimentos bajos en grasas saturadas, libres de grasas trans y bajos en grasas, . Elija quesos que tengan solo de 2 a 3 g de grasa saturada por onza (28,35 g). Use una margarina que no dae el corazn, libre de grasas trans o aceite parcialmente hidrogenado. Al comprar alimentos horneados (galletitas dulces y galletas) evite el aceite parcialmente  hidrogenado. Los panes y bollos debern ser de granos enteros (harina de maz o de avena entera, en lugar de "harina" o "harina enriquecida"). Compre sopas en lata que no sean cremosas, con bajo contenido de sal y sin grasas adicionadas.   TCNICAS DE PREPARACIN DE LOS ALIMENTOS  Nunca fra los alimentos en aceite abundante. Si debe frer, hgalo en poco aceite y removiendo constantemente, porque as se utilizan muy pocas grasas, o utilice un spray antiadherente. Cuando le sea posible, hierva, hornee o ase las carnes y cocine los vegetales al vapor. En vez de aderezar los vegetales con mantequilla o margarina, utilice limn y hierbas, pur de manzanas y canela (para las calabazas y batatas), yogurt y salsa descremados y aderezos para ensaladas bajos en contenido graso.   BAJO EN GRASAS SATURADAS / SUSTITUTOS BAJOS EN GRASA  Carnes / Grasas saturadas (g)  Evite: Bife, corte graso (3 oz/85 g) / 11 g   Elija: Bife, corte magro (3 oz/85 g) / 4 g   Evite: Hamburguesa (3 oz/85 g) / 7 g   Elija:  Hamburguesa magra (3 oz/85 g) / 5 g   Evite: Jamn (3 oz/85 g) / 6 g   Elija:  Jamn magro (3 oz/85 g) / 2.4 g   Evite: Pollo, con piel (3 oz/85 g), Carne oscura / 4 g   Elija:  Pollo, sin piel (  3 oz/85 g), Carne oscura / 2 g   Evite: Pollo, con piel (3 oz/85 g), Carne magra / 2.5 g   Elija: Pollo, sin piel (3 oz/85 g), Carne magra / 1 g  Lcteos / Grasas saturadas (g)  Evite: Leche entera (1 taza) / 5 g   Elija: Leche con bajo contenido de grasa, 2% (1 taza) / 3 g   Elija: Leche con bajo contenido de grasa, 1% (1 taza) / 1.5 g   Elija: Leche descremada (1 taza) / 0.3 g   Evite: Queso duro (1 oz/28 g) / 6 g   Elija: Queso descremado (1 oz/28 g) / 2-3 g   Evite: Queso cottage, 4% grasa (1 taza)/ 6.5 g   Elija: Queso cottage con bajo contenido de grasa, 1% grasa (1 taza)/ 1.5 g   Evite: Helado (1 taza) / 9 g   Elija: Sorbete (1 taza) / 2.5 g   Elija: Yogurt helado sin contenido de  grasa (1 taza) / 0.3 g   Elija: Barras de fruta congeladas / vestigios   Evite: Crema batida (1 cucharada) / 3.5 g   Elija: Batidos glac sin lcteos (1 cucharada) / 1 g  Condimentos / Grasas saturadas (g)  Evite: Mayonesa (1 cucharada) / 2 g   Elija: Mayonesa con bajo contenido de grasa (1 cucharada) / 1 g   Evite: Manteca (1 cucharada) / 7 g   Elija: Margarina extra light (1 cucharada) / 1 g   Evite: Aceite de coco (1 cucharada) / 11.8 g   Elija: Aceite de oliva (1 cucharada) / 1.8 g   Elija: Aceite de maz (1 cucharada) / 1.7 g   Elija: Aceite de crtamo (1 cucharada) / 1.2 g   Elija: Aceite de girasol (1 cucharada) / 1.4 g   Elija: Aceite de soja (1 cucharada) / 2.4 g   Elija: Aceite de canola (1 cucharada) / 1 g  Document Released: 09/04/2005 Document Revised: 05/17/2011 [employer] Patient Information 2012 [employer]. Ejercicios para perder peso (Exercise to Lose Weight) La actividad fsica y una dieta saludable ayudan a perder peso. El mdico podr sugerirle ejercicios especficos. IDEAS Y CONSEJOS PARA HACER EJERCICIOS  Elija opciones econmicas que disfrute hacer , como caminar, andar en bicicleta o los vdeos para ejercitarse.   Utilice las escaleras en lugar del ascensor.   Camine durante la hora del almuerzo.   Estacione el auto lejos del lugar de trabajo o estudio.   Concurra a un gimnasio o tome clases de gimnasia.   Comience con 5  10 minutos de actividad fsica por da. Ejercite hasta 30 minutos, 4 a 6 das por semana.   Utilice zapatos que tengan un buen soporte y ropas cmodas.   Elongue antes y despus de ejercitar.   Ejercite hasta que aumente la respiracin y el corazn palpite rpido.   Beba agua extra cuando ejercite.   No haga ejercicio hasta lastimarse, sentirse mareado o que le falte mucho el aire.  La actividad fsica puede quemar alrededor de 150 caloras.  Correr 20 cuadras en 15 minutos.   Jugar vley durante 45 a 60  minutos.   Limpiar y encerar el auto durante 45 a 60 minutos.   Jugar ftbol americano de toque.   Caminar 25 cuadras en 35 minutos.   Empujar un cochecito 20 cuadras en 30 minutos.   Jugar baloncesto durante 30 minutos.   Rastrillar hojas secas durante 30 minutos.   Andar en bicicleta 80 cuadras en 30 minutos.     Caminar 30 cuadras en 30 minutos.   Bailar durante 30 minutos.   Quitar la nieve con una pala durante 15 minutos.   Nadar vigorosamente durante 20 minutos.   Subir escaleras durante 15 minutos.   Andar en bicicleta 60 cuadras durante 15 minutos.   Arreglar el jardn entre 30 y 45 minutos.   Saltar a la soga durante 15 minutos.   Limpiar vidrios o pisos durante 45 a 60 minutos.  Document Released: 12/09/2010 Document Revised: 05/17/2011 [employer] Patient Information 2012 [employer]. 

## 2013-11-24 NOTE — Progress Notes (Addendum)
Jasmine PandySoledad Campbell Mar 09, 1966 [id number]   History:    48 y.o.  for annual gyn exam who has not been seen in the office is 2012. She states that her primary physician had diagnosed her with fatty liver disease but she has not seen an ovary year. The patient in [place] have her IUD changed. She now has a Mirena IUD that was placed in July of 2012. Patient had an isolated event this past week of feeling dizzy but has not recurred again. No loss of consciousness reported. Her new PCP now is Dr. Luiz Ironabeza which she states she scheduled to see in April who she sees every 6 months but wanted to have her fasting blood work drawn today here.  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  Gynecologic History Patient's last menstrual period was 11/14/2013. Contraception: IUD Last Pap: 2012. Results were: normal Last mammogram: 2014. Results were: Patient states it was normal [place] [facility]  Obstetric History OB History  Gravida Para Term Preterm AB SAB TAB Ectopic Multiple Living  1 1 1       1     # Outcome Date GA Lbr Len/2nd Weight Sex Delivery Anes PTL Lv  1 TRM     M CS   Y       ROS: A ROS was performed and pertinent positives and negatives are included in the history.  GENERAL: No fevers or chills. HEENT: No change in vision, no earache, sore throat or sinus congestion. NECK: No pain or stiffness. CARDIOVASCULAR: No chest pain or pressure. No palpitations. PULMONARY: No shortness of breath, cough or wheeze. GASTROINTESTINAL: No abdominal pain, nausea, vomiting or diarrhea, melena or bright red blood per rectum. GENITOURINARY: No urinary frequency, urgency, hesitancy or dysuria. MUSCULOSKELETAL: No joint or muscle pain, no back pain, no recent  trauma. DERMATOLOGIC: No rash, no itching, no lesions. ENDOCRINE: No polyuria, polydipsia, no heat or cold intolerance. No recent change in weight. HEMATOLOGICAL: No anemia or easy bruising or bleeding. NEUROLOGIC: No headache, seizures, numbness, tingling or weakness. PSYCHIATRIC: No depression, no loss of interest in normal activity or change in sleep pattern.     Exam: chaperone present  BP 124/80  Ht 5\' 3"  (1.6 m)  Wt 198 lb 6.4 oz (89.994 kg)  BMI 35.15 kg/m2  LMP 11/14/2013  Body mass index is 35.15 kg/(m^2).  General appearance : Well developed well nourished female. No acute distress HEENT: Neck supple, trachea midline, no carotid bruits, no thyroidmegaly Lungs: Clear to auscultation, no rhonchi or wheezes, or rib retractions  Heart: Regular rate and rhythm, no murmurs or gallops Breast:Examined in sitting and supine position were symmetrical in appearance, no palpable masses or tenderness,  no skin retraction, no nipple inversion, no nipple discharge, no skin discoloration, no axillary or supraclavicular lymphadenopathy Abdomen: no palpable masses or tenderness, no rebound or guarding Extremities: no edema or skin discoloration or tenderness  Pelvic:  Bartholin, Urethra, Skene Glands: Within normal limits             Vagina: No gross lesions or discharge  Cervix: No gross lesions or discharge, IUD string seen  Uterus  anteverted, normal size, shape and consistency, non-tender and mobile  Adnexa  Without masses or tenderness  Anus and perineum  normal  Rectovaginal  normal sphincter tone without palpated masses or tenderness             Hemoccult not indicated     Assessment/Plan:  48 y.o. female for annual exam for next year need to have a colonoscopy. The fasting blood work drawn today were as follows: Hemoglobin A1c, fasting lipid profile, comprehensive metabolic panel, CBC, and TSH. She was reminded on the importance of monthly self breast examination. We discussed  importance of calcium and vitamin D for osteoporosis prevention. She will pull a copy of these lab results next week from my chart to take her primary care physician next month. Pap smear was done today.   Note: This dictation was prepared with  Dragon/digital dictation along withSmart phrase technology. Any transcriptional errors that result from this process are unintentional.   Ok Edwards MD, 10:23 AM 11/24/2013

## 2013-11-25 LAB — URINALYSIS W MICROSCOPIC + REFLEX CULTURE
Bilirubin Urine: NEGATIVE
Casts: NONE SEEN
Crystals: NONE SEEN
GLUCOSE, UA: NEGATIVE mg/dL
Ketones, ur: NEGATIVE mg/dL
Nitrite: NEGATIVE
PROTEIN: NEGATIVE mg/dL
Specific Gravity, Urine: 1.014 (ref 1.005–1.030)
UROBILINOGEN UA: 0.2 mg/dL (ref 0.0–1.0)
pH: 5.5 (ref 5.0–8.0)

## 2013-11-26 ENCOUNTER — Other Ambulatory Visit: Payer: Self-pay | Admitting: Gynecology

## 2013-11-26 MED ORDER — NITROFURANTOIN MONOHYD MACRO 100 MG PO CAPS
100.0000 mg | ORAL_CAPSULE | Freq: Two times a day (BID) | ORAL | Status: DC
Start: 2013-11-26 — End: 2014-01-01

## 2013-11-28 LAB — URINE CULTURE

## 2013-12-18 ENCOUNTER — Encounter: Payer: Self-pay | Admitting: Gynecology

## 2014-01-01 ENCOUNTER — Ambulatory Visit (INDEPENDENT_AMBULATORY_CARE_PROVIDER_SITE_OTHER): Payer: 59 | Admitting: Gynecology

## 2014-01-01 DIAGNOSIS — B373 Candidiasis of vulva and vagina: Secondary | ICD-10-CM

## 2014-01-01 DIAGNOSIS — N390 Urinary tract infection, site not specified: Secondary | ICD-10-CM

## 2014-01-01 DIAGNOSIS — B3731 Acute candidiasis of vulva and vagina: Secondary | ICD-10-CM

## 2014-01-01 DIAGNOSIS — L293 Anogenital pruritus, unspecified: Secondary | ICD-10-CM

## 2014-01-01 DIAGNOSIS — N898 Other specified noninflammatory disorders of vagina: Secondary | ICD-10-CM

## 2014-01-01 DIAGNOSIS — R300 Dysuria: Secondary | ICD-10-CM

## 2014-01-01 LAB — URINALYSIS W MICROSCOPIC + REFLEX CULTURE
Bilirubin Urine: NEGATIVE
Casts: NONE SEEN
Crystals: NONE SEEN
GLUCOSE, UA: NEGATIVE mg/dL
HGB URINE DIPSTICK: NEGATIVE
Ketones, ur: NEGATIVE mg/dL
Nitrite: NEGATIVE
PROTEIN: NEGATIVE mg/dL
RBC / HPF: NONE SEEN RBC/hpf (ref <3)
Specific Gravity, Urine: 1.01 (ref 1.005–1.030)
UROBILINOGEN UA: 0.2 mg/dL (ref 0.0–1.0)
pH: 7 (ref 5.0–8.0)

## 2014-01-01 LAB — WET PREP FOR TRICH, YEAST, CLUE
CLUE CELLS WET PREP: NONE SEEN
TRICH WET PREP: NONE SEEN

## 2014-01-01 MED ORDER — FLUCONAZOLE 150 MG PO TABS: ORAL_TABLET | ORAL | Status: DC

## 2014-01-01 MED ORDER — SULFAMETHOXAZOLE-TMP DS 800-160 MG PO TABS: 1.0000 | ORAL_TABLET | Freq: Two times a day (BID) | ORAL | Status: DC

## 2014-01-01 MED ORDER — PHENAZOPYRIDINE HCL 200 MG PO TABS: 200.0000 mg | ORAL_TABLET | Freq: Three times a day (TID) | ORAL | Status: DC | PRN

## 2014-01-01 NOTE — Progress Notes (Signed)
    Patient presented to the office or vulvar parotids, dysuria and slight white discharge. Patient has [place] IUD for contraception. Patient states that her husband also has been complaining of penile pruritus. Patient denied any fever, chills, nausea, or vomiting. No frequency reported only dysuria.  Exam: Back: No CVA tenderness Abdomen: Soft nontender no rebound guarding Pelvic: The urethra Skene was within normal limits Vagina: White thick discharge was noted Cervix: IUD string visualized Uterus: Anteverted normal size shape and consistency Adnexa: No palpable mass or tenderness Rectal exam not done  Wet prep moderate yeast, tumors, white blood cell, moderate bacteria  GC chlamydia culture obtained results pending at time of dictation  Urinalysis 7-10 WBC, few bacteria urine culture pending  Assessment/plan: Patient with yeast vulvovaginitis will be prescribed for And 50 Mg 1 by Mouth Today. Based on Clinical Exam Presumption of Urinary Tract Infection She'll Be Treated with Septra Twice a Day for 7 Days and Prescription for Pyridium 200 mg 3 times a day for 2-3 days as an anti-spasmodic agent. Will notify the patient there is any [facility]lam[facility].

## 2014-01-01 NOTE — Patient Instructions (Addendum)
Sulfamethoxazole; Trimethoprim, SMX-TMP tablets Qu es este medicamento? El compuesto SULFAMETOXAZOL; TRIMETOPRIMA o SMX-TMP es una combinacin de un antibitico sulfonamida y un segundo antibitico, trimetoprima. Se utiliza para tratar o prevenir ciertos tipos de infecciones bacterianas. No es efectivo para resfros, gripe u otras infecciones de origen viral. Este medicamento puede ser utilizado para otros usos; si tiene alguna pregunta consulte con su proveedor de atencin mdica o con su farmacutico. MARCAS COMERCIALES DISPONIBLES: Bacter-Aid DS , Bactrim DS, Bactrim, Septra DS, Septra Qu le debo informar a mi profesional de la salud antes de tomar este medicamento? Necesita saber si usted presenta alguno de los [employer] o situaciones: -anemia -asma -recibe tratamiento con anticonvulsivos -si consume bebidas alcohlicas con frecuencia -enfermedad renal -enfermedad heptica -bajo nivel de cido flico o de glucosa-6-fosfato deshidrogenasa -malnutricin o malabsorcin -porfiria -alergias severas -trastorno tiroideo -una reaccin alrgica o inusual al sulfametoxazol, a la trimetoprima, sulfonamidas, a otros medicamentos, alimentos, colorantes o conservantes -si est embarazada o buscando quedar embarazada -si est amamantando a un beb Cmo debo utilizar este medicamento? Tome este medicamento por va oral con un vaso lleno de agua. Siga las instrucciones de la etiqueta del [place]. Tome sus dosis a intervalos regulares. No tome su medicamento con una frecuencia mayor que la indicada. No omita ninguna dosis o suspenda el uso de su medicamento antes de lo indicado. Hable con su pediatra para informarse acerca del uso de este medicamento en nios. Puede requerir atencin especial. Aunque este medicamento puede ser recetado a nios tan menores como de 2 meses de [place]. Sobredosis: Pngase en contacto inmediatamente con un centro toxicolgico o una sala de urgencia si usted  cree que haya tomado demasiado medicamento. ATENCIN: [employer] es solo para usted. No comparta este medicamento con nadie. Qu sucede si me olvido de una dosis? Si olvida una dosis, tmela lo antes posible. Si es casi la hora de la prxima dosis, tome slo esa dosis. No tome dosis adicionales o dobles. Qu puede interactuar con este medicamento? No tome esta medicina con ninguno de los siguientes medicamentos: -aminobenzoato de potasio -dofetilida -metronidazol Esta medicina tambin puede interactuar con los siguientes medicamentos: -inhibidores de la ECA, benazepril, enalapril, lisinopril o ramipril -ciclosporina -digoxina -diurticos -indometacina -medicamentos para la diabetes -metenamina -metotrexato -fenitona -suplementos de potasio -pirimetamina -sulfinpirazona -antidepresivos tricclicos -warfarina Puede ser que esta lista no menciona todas las posibles interacciones. Informe a su profesional de [employer] de [employer] productos a base de hierbas, medicamentos de [place] o suplementos nutritivos que est tomando. Si usted fuma, consume bebidas alcohlicas o si utiliza drogas ilegales, indqueselo tambin a su profesional de [employer]. Algunas sustancias pueden interactuar con su medicamento. A qu debo estar atento al usar [employer]? Consulte a su mdico o a su profesional de la salud si sus sntomas no mejoran. Beba varios vasos de Warehouse manager. Esto le ayudar a Software engineer de Pharmacist, hospital. No trate la diarrea con productos de [employer]. Comunquese con su mdico si tiene diarrea que dura ms de 2 das o si es severa y [place]. Este medicamento puede aumentar la sensibilidad al sol. Mantngase fuera de Secretary/administrator. Si no lo puede evitar, utilice ropa protectora y crema de Orthoptist. No utilice lmparas solares, camas solares ni cabinas solares. Qu efectos secundarios puedo tener al [employer] este medicamento? Efectos secundarios  que debe informar a su mdico o a Producer, television/film/video de la salud tan pronto como sea posible: -reacciones alrgicas como erupcin cutnea o  urticarias, hinchazn de la cara, labios o lengua -problemas respiratorios -fiebre, escalofros, dolor de garganta -pulso cardiaco irregular o dolor en el pecho -dolores articulares o musculares -dolor o dificultad para orinar -puntos rojos en la piel -enrojecimiento, formacin de ampollas, descamacin o distensin de la piel, inclusive dentro de la boca -sangrado o magulladuras inusuales -cansancio o debilidad inusual -color amarillento de los ojos o la piel Efectos secundarios que, por lo general, no requieren Psychologist, prison and probation services (debe informarlos a su mdico o a su profesional de la salud si persisten o si son molestos): -diarrea -mareos -dolor de Training and development officer -prdida del apetito -nuseas, vmito -nerviosismo Puede ser que esta lista no menciona todos los posibles efectos secundarios. Comunquese a su mdico por asesoramiento mdico [employer]. Usted puede informar los efectos secundarios a la FDA por telfono al 1-800-FDA-1088. Dnde debo guardar mi medicina? Mantngala fuera del alcance de los nios. Gurdela a [employer], entre 20 y 25 grados C (41 y 51 grados F). Protjala de la luz. Deseche todo el medicamento que no haya utilizado, despus de la fecha de vencimiento. ATENCIN: Este folleto es un resumen. Puede ser que no cubra toda la posible informacin. Si usted tiene preguntas acerca de esta medicina, consulte con su mdico, su farmacutico o su profesional de Radiographer, therapeutic.  2014, Elsevier/Gold Standard. (2007-09-04 15:15:00) Infeccin urinaria  (Urinary Tract Infection)  La infeccin urinaria puede ocurrir en cualquier lugar del tracto urinario. El tracto urinario es un sistema de drenaje del cuerpo por el que se eliminan los desechos y el exceso de [place]. El tracto urinario est formado por dos riones, dos urteres, la  vejiga y Engineer, mining. Los riones son rganos que tienen forma de frijol. Cada rin tiene aproximadamente el tamao del puo. Estn situados debajo de las [place], uno a cada lado de la columna vertebral CAUSAS  La causa de la infeccin son los microbios, que son organismos microscpicos, que incluyen hongos, virus, y bacterias. Estos organismos son tan pequeos que slo pueden verse a travs del microscopio. Las bacterias son los microorganismos que ms comnmente causan infecciones urinarias.  SNTOMAS  Los sntomas pueden variar segn la edad y el sexo del paciente y por la ubicacin de la infeccin. Los sntomas en las mujeres jvenes incluyen la necesidad frecuente e intensa de orinar y una sensacin dolorosa de ardor en la vejiga o en la uretra durante la miccin. Las mujeres y los hombres mayores podrn sentir cansancio, temblores y debilidad y Futures trader musculares y Engineer, mining abdominal. Si tiene [place], puede significar que la infeccin est en los riones. Otros sntomas son dolor en la espalda o en los lados debajo de las [place], nuseas y vmitos.  DIAGNSTICO  Para diagnosticar una infeccin urinaria, el mdico le preguntar acerca de sus sntomas. [employer] una [place] de [place]. La muestra de orina se analiza para Engineer, manufacturing bacterias y glbulos blancos de Risk manager. Los glbulos blancos se forman en el organismo para ayudar a Artist las infecciones.  TRATAMIENTO  Por lo general, las infecciones urinarias pueden tratarse con medicamentos. Debido a que la [employer]son de las infecciones son causadas por bacterias, por lo general pueden tratarse con antibiticos. La eleccin del antibitico y la duracin del tratamiento depender de sus sntomas y el tipo de bacteria causante de la infeccin.  INSTRUCCIONES PARA EL CUIDADO EN EL HOGAR   Si le recetaron antibiticos, tmelos exactamente como su mdico le indique. Termine el medicamento aunque se sienta mejor despus de haber tomado  slo algunos.  Beba gran cantidad de lquido para mantener la orina de tono claro o color amarillo plido.  Evite la cafena, el t y las [address] Hospital Placebebidas gaseosas. Estas sustancias irritan la vejiga.  Vaciar la vejiga con frecuencia. Evite retener la orina durante largos perodos.  Vace la vejiga antes y despus de Management consultanttener relaciones sexuales.  Despus de mover el intestino, las mujeres deben higienizarse la regin perineal desde adelante hacia atrs. Use slo un papel tissue por vez. SOLICITE ATENCIN MDICA SI:   Siente dolor en la espalda.  Le sube la fiebre.  Los sntomas no mejoran luego de [address] das. SOLICITE ATENCIN MDICA DE INMEDIATO SI:   Siente dolor intenso en la espalda o en la zona inferior del abdomen.  Comienza a sentir escalofros.  Tiene nuseas o vmitos.  Tiene una sensacin continua de quemazn o molestias al ConocoPhillipsorinar. ASEGRESE DE QUE:   Comprende estas instrucciones.  Controlar su enfermedad.  Solicitar ayuda de inmediato si no mejora o empeora. Document Released: 06/14/2005 Document Revised: 05/29/2012 [facility] Patient Information 2014 [place]woodExitCare, MarylandLLC.  Infeccin por cndida en adultos (Candida Infection, Adult) Una infeccin por Cndida (tambin llamado infeccin por hongos levaduriformes, o infeccin por Monilia) es un crecimiento excesivo de hongos que puede ocurrir en cualquier parte del cuerpo. Una infeccin por cndida comnmente ocurre en las zonas ms calientes y hmedas del cuerpo. Usualmente, la infeccin permanece localizada pero puede diseminarse y convertirse en una infeccin sistmica. Una infeccin por cndida puede ser signo de un trastorno ms grave como diabetes, leucemia, o SIDA. Una infeccin por cndida puede ocurrir tanto en hombres como en mujeres. En mujeres, la vaginitis Cndida es una infeccin vaginal. Se trata de una de las causas ms frecuentes de la vaginitis. Los hombres no suelen tener sntomas [place] que se le aparecen otros  problemas. Pueden descubrir que tienen una infeccin por hongos porque su compaera sexual los tiene. Es ms probable que los hombres no circuncisos adquieran una infeccin por hongo que aquellos que estn circuncindados. Esto se debe a que el glande no circuncidado no est expuesto al aire y no se mantiene tan seco como un glande circuncidado. Los ONEOKadultos mayores pueden desarrollar infecciones por cndida en la zona de la dentadura. CAUSAS Mujeres  Antibiticos.  Medicamento con esteroides Network engineerdurante mucho tiempo.  Tener sobrepeso (obesidad).  Diabetes.  Sistema inmune deficiente.  Ciertas enfermedades.  Medicamentos inmunosupresores para pacientes con trasplante de rganos.  Quimioterapia.  El [place]embarazo.  Menstruacin.  Estrs o fatiga.  Consumo de drogas de forma intravenosa.  Anticonceptivos orales.  Utilizar ropa ajustada en la zona de la entrepierna.  Contagio a partir de un compaero sexual que ya tiene la infeccin.  Los espermicidas.  Catteres intravenosos, urinarios, u de otro tipo. Hombres  Contagio a partir de una compaera sexual que ya tiene la infeccin.  Tener sexo oral o anal con una persona que tiene la infeccin.  Los espermicidas.  Diabetes.  Antibiticos.  Sistema inmune deficiente.  Medicamentos que suprimen el sistema inmune.  El uso de drogas por va intravenosa.  Intravenosa, urinarias o catteres otros. SNTOMAS Mujeres  Flujo vaginal espeso y blanco.  Picazn vaginal.  Enrojecimiento e hinchazn en la zona de la vagina.  Irritacin de los labios de la vagina y perineo.  lceras en labios vaginales y perineo.  Relaciones sexuales dolorosas.  Bajo nivel de Bankerazcar en la sangre (hipoglucemia).  Dolor al Beatrix Shipperorinar.  Infecciones en la vejiga.  Problemas intestinales como constipacin, indigestin, mal aliento, hinchazn, gases,  diarrea o heces blandas. Hombres  Primero los hombres desarrollan problemas intestinales como  constipacin, indigestin, mal aliento, hinchazn, gases, diarrea o heces blandas.  Piel del pene seca y Sudanquebradiza con picazn o molestias.  Prurito de Estate agentla ingle.  Piel seca y escamosa.  Pie de atleta.  Hipoglucemia. DIAGNSTICO Mujeres  Se revisa el historial y se realiza un anlisis.  La supuracin se observa con un microscopio.  Deber tomarse un cultivo de Administrator, artsla secrecin. Hombres  Se revisa el historial y se realiza un anlisis.  Se observar cualquier secrecin proveniente del pene y la piel quebrada en el microscopio y se Education officer, environmentalrealizar un cultivo.  Podrn realizarle cultivos de heces. TRATAMIENTO Mujeres  Supositorios y cremas antifngicos vaginales.  Cremas medicadas para disminuir la picazn e irritacin de la zona externa de la vagina.  Aplique una bolsa caliente en la zona del perineo para disminuir molestias o inflamacin.  Medicamentos antifngicos orales.  Supositorios vaginales o cremas medicinales, para infecciones repetidas o recurrentes.  Lave y seque la zona por completo antes de Comptrolleraplicar la crema.  La ingesta de yogur con lactobacillus le ayudar con la prevencin y [place]el tratamiento.  Si las cremas y supositorios no funcionan, la aplicacin de solucin violeta de genciana puede ayudar. Hombres  Cremas anti hongos y medicamentos orales anti hongos.  A veces el tratamiento deber continuar por 30 das despus de que los sntomas desaparecen para prevenir la recurrencia. INSTRUCCIONES PARA EL CUIDADO DOMICILIARIO Mujeres  Utilice ropa interior de algodn y evite las ropas ajustadas.  Evite el papel higinico de color o perfumado y los tampones o toallitas con desodorante.  No utilice duchas vaginales.  Mantenga la diabetes bajo control.  Tome todos los medicamentos tal como se le indic.  Mantenga su piel limpia y Cocos (Ke[place]) Islandsseca.  Consuma leche o yogur con lactobacillus de Stratfordmanera regular. Si contrae infecciones por levaduras frecuentes y cree saber cul es  la infeccin, existen medicamentos de [place] Cityventa libre. Si la infeccin no parece curarse en 3 das, hable con el mdico.  Comunique a su compaero sexual que padece una infeccin por hongos. El tambin Network engineernecesitar tratamiento, en especial si la infeccin no desaparece o es recurrente. Hombres  Mantenga su piel limpia y Cocos ([place]) Islandsseca.  Mantenga la diabetes bajo control.  Tome todos los medicamentos tal como se le indic.  Comunique a su compaera sexual que sufre una infeccin por hongos. SOLICITE ANTENCIN MDICA SI:  Los sntomas no desaparecen o empeoran luego de 1 semana de tratamiento.  Usted tiene una temperatura oral de ms de 38,9 C (102 F).  Presenta dificultades para tragar o para comer durante un tiempo prolongado.  Aparecen ampollas en los genitales.  Si aparece una hemorragia vaginal y no es el momento del perodo.  Siente dolor abdominal.  Presentara problemas intestinales como los ya descritos.  Si se siente dbil o desfalleciente.  Siente dolor al Geographical information systems officerorinar u observa una mayor cantidad [place] orina.  Tiene dolor durante las The St. Paul Travelersrelaciones sexuales. ASEGRESE DE QUE:  Comprende esas instrucciones para el alta mdica.  Controlar su enfermedad.  Pedir ayuda de inmediato si no mejora o empeora. Document Released: 09/04/2005 Document Revised: 11/27/2011 [facility]Exit[facility] Patient Information 2014 [place]sideExitCare, MarylandLLC.

## 2014-01-02 LAB — GC/CHLAMYDIA PROBE AMP
CT PROBE, AMP APTIMA: NEGATIVE
GC Probe RNA: NEGATIVE

## 2014-01-02 LAB — URINE CULTURE
Colony Count: NO GROWTH
ORGANISM ID, BACTERIA: NO GROWTH

## 2014-04-20 ENCOUNTER — Other Ambulatory Visit: Payer: Self-pay | Admitting: Gynecology

## 2014-04-20 DIAGNOSIS — Z1231 Encounter for screening mammogram for malignant neoplasm of breast: Secondary | ICD-10-CM

## 2014-04-28 ENCOUNTER — Ambulatory Visit (HOSPITAL_COMMUNITY)
Admission: RE | Admit: 2014-04-28 | Discharge: 2014-04-28 | Disposition: A | Discharge status: Home / Self Care | Payer: 59 | Source: Ambulatory Visit | Attending: Gynecology | Admitting: Gynecology

## 2014-04-28 DIAGNOSIS — Z1231 Encounter for screening mammogram for malignant neoplasm of breast: Secondary | ICD-10-CM | POA: Diagnosis not present

## 2014-07-20 ENCOUNTER — Encounter: Payer: Self-pay | Admitting: Gynecology

## 2014-09-04 ENCOUNTER — Ambulatory Visit (INDEPENDENT_AMBULATORY_CARE_PROVIDER_SITE_OTHER): Payer: 59 | Admitting: Emergency Medicine

## 2014-09-04 VITALS — BP 112/72 | HR 68 | Temp 97.7°F | Resp 18 | Ht 64.5 in | Wt 202.0 lb

## 2014-09-04 DIAGNOSIS — R42 Dizziness and giddiness: Secondary | ICD-10-CM

## 2014-09-04 DIAGNOSIS — R112 Nausea with vomiting, unspecified: Secondary | ICD-10-CM

## 2014-09-04 LAB — POCT CBC
Granulocyte percent: 73.9 %G (ref 37–80)
HEMATOCRIT: 39.9 % (ref 37.7–47.9)
HEMOGLOBIN: 13.1 g/dL (ref 12.2–16.2)
LYMPH, POC: 1.6 (ref 0.6–3.4)
MCH, POC: 28.2 pg (ref 27–31.2)
MCHC: 32.9 g/dL (ref 31.8–35.4)
MCV: 85.7 fL (ref 80–97)
MID (cbc): 0.4 (ref 0–0.9)
MPV: 7.1 fL (ref 0–99.8)
POC Granulocyte: 5.8 (ref 2–6.9)
POC LYMPH PERCENT: 20.7 %L (ref 10–50)
POC MID %: 5.4 % (ref 0–12)
Platelet Count, POC: 301 10*3/uL (ref 142–424)
RBC: 4.65 M/uL (ref 4.04–5.48)
RDW, POC: 12.4 %
WBC: 7.8 10*3/uL (ref 4.6–10.2)

## 2014-09-04 LAB — GLUCOSE, POCT (MANUAL RESULT ENTRY): POC Glucose: 109 mg/dl — AB (ref 70–99)

## 2014-09-04 MED ORDER — ONDANSETRON 4 MG PO TBDP
8.0000 mg | ORAL_TABLET | Freq: Once | ORAL | Status: AC
  Administered 2014-09-04: 8 mg via ORAL

## 2014-09-04 MED ORDER — MECLIZINE HCL 12.5 MG PO TABS: 12.5000 mg | ORAL_TABLET | Freq: Three times a day (TID) | ORAL | Status: DC | PRN

## 2014-09-04 MED ORDER — ONDANSETRON 8 MG PO TBDP: 8.0000 mg | ORAL_TABLET | Freq: Three times a day (TID) | ORAL | Status: DC | PRN

## 2014-09-04 NOTE — Progress Notes (Addendum)
Subjective:  This chart was scribed for Earl LitesSteve Kezia Benevides, MD by Haywood PaoNadim Abu Hashem, ED Scribe at [facility].The patient was seen in exam room 02 and the patient's care was started at 12:17 PM.   Patient ID: Jasmine Campbell, female    DOB: 20-Sep-1965, 48 y.o.   MRN: [id number] Chief Complaint  Patient presents with  . Emesis  . Dizziness   HPI HPI Comments: Jasmine Campbell is a 48 y.o. female [facility] of [facility]iness and vomiting onset today. This morning pt woke up at 4:30 AM with a dizzy spell. While at work around 9:00 AM the pt had another dizzy spell. She describes the dizziness as room spinning and has a buzzing sensation in her ears associated with vertigo. Pt has thrown up once pta and once in the exam room. Pt notes feeling hot and has trouble with ambulating. Changing positions does not improve her symptoms.She has had this complaint before and was given ginger ale for relief.Pt denies fever, hearing loss, weakness in arms and legs   Patient Active Problem List   Diagnosis Date Noted  . Weight gain 11/24/2013  . Other and unspecified hyperlipidemia 11/24/2013  . Fatty liver 11/24/2013  . Family history of diabetes mellitus 11/24/2013  . IUD (intrauterine device) in place 11/24/2013   History reviewed. No pertinent past medical history. Past Surgical History  Procedure Laterality Date  . Cesarean section      OVARIAN CYSTECTOMY  . Pelvic laparoscopy      RIGHT OVARIAN CYSTECTOMY  . Intrauterine device insertion  2002    PARAGUARD T-380   No Known Allergies Prior to Admission medications   Medication Sig Start Date End Date Taking? Authorizing Provider  Collagen 500 MG CAPS Take by mouth.    Historical Provider, MD  fluconazole (DIFLUCAN) 150 MG tablet Take one now repeat in 48 hours Patient not taking: Reported on 09/04/2014 01/01/14   Ok EdwardsJuan H Fernandez, MD  phenazopyridine (PYRIDIUM) 200 MG tablet Take 1 tablet (200  mg total) by mouth 3 (three) times daily as needed for pain. Patient not taking: Reported on 09/04/2014 01/01/14   Ok EdwardsJuan H Fernandez, MD  sulfamethoxazole-trimethoprim (BACTRIM DS) 800-160 MG per tablet Take 1 tablet by mouth 2 (two) times daily. Patient not taking: Reported on 09/04/2014 01/01/14   Ok EdwardsJuan H Fernandez, MD  vitamin E 400 UNIT capsule Take 400 Units by mouth daily.    Historical Provider, MD   History   Social History  . Marital Status: Legally Separated    Spouse Name: N/A    Number of Children: 1  . 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 Smoking status: Never Smoker   . Smokeless tobacco: Never Used  . Alcohol Use: No     Comment: occasional  . Drug Use: No  . Sexual Activity:    Partners: Male    Birth Control/ Protection: IUD     Comment: paragard   Other Topics Concern  . Not on file   Social History Narrative   Her [place] in [place]. [place] from [place], [place].   Review of Systems  Constitutional: Negative for fever.  HENT: Negative for hearing loss.   Gastrointestinal: Positive for nausea and vomiting.  Neurological: Positive for dizziness. Negative for weakness.      Objective:  BP 112/72 mmHg  Pulse 68  Temp(Src)  97.7 F (36.5 C) (Oral)  Resp 18  Ht 5' 4.5" (1.638 m)  Wt 202 lb (91.627 kg)  BMI 34.15 kg/m2  SpO2 99%  LMP 08/23/2014  Physical Exam  Constitutional: She is oriented to person, place, and time. She appears well-developed and well-nourished. No distress.  Pt appears somewhat pale but cooperative.  HENT:  Head: Normocephalic and atraumatic.  Eyes: EOM are normal.  Neck: Normal range of motion.  Cardiovascular: Normal rate, regular rhythm and normal heart sounds.   Pulmonary/Chest: Effort normal and breath sounds normal.  Neurological: She is alert and oriented to person, place, and time. No cranial nerve deficit.  Skin: Skin  is warm and dry.  Psychiatric: She has a normal mood and affect. Her behavior is normal.  Nursing note and vitals reviewed.  Results for orders placed or performed in visit on 09/04/14  POCT CBC  Result Value Ref Range   WBC 7.8 4.6 - 10.2 K/uL   Lymph, poc 1.6 0.6 - 3.4   POC LYMPH PERCENT 20.7 10 - 50 %L   MID (cbc) 0.4 0 - 0.9   POC MID % 5.4 0 - 12 %M   POC Granulocyte 5.8 2 - 6.9   Granulocyte percent 73.9 37 - 80 %G   RBC 4.65 4.04 - 5.48 M/uL   Hemoglobin 13.1 12.2 - 16.2 g/dL   HCT, POC 96.039.9 45.437.7 - 47.9 %   MCV 85.7 80 - 97 fL   MCH, POC 28.2 27 - 31.2 pg   MCHC 32.9 31.8 - 35.4 g/dL   RDW, POC 09.812.4 %   Platelet Count, POC 301 142 - 424 K/uL   MPV 7.1 0 - 99.8 fL  POCT glucose (manual entry)  Result Value Ref Range   POC Glucose 109 (A) 70 - 99 mg/dl      Assessment & Plan:  Patient improved after Zofran. Her nausea is better after Zofran. She presents with vertigo and tinnitus. Will treat with a combination of Zofran and Antivert along with clear liquids. Patient advised to return to clinic tomorrow if she is had persistent dizziness so she can receive IV fluids. She certainly could have an otolith which is causing the symptoms.I personally performed the services described in this documentation, which was scribed in my presence. The recorded information has been reviewed and is accurate.

## 2014-09-04 NOTE — Patient Instructions (Signed)
Vértigo postural benigno °(Benign Positional Vertigo) ° Vértigo es la sensación de que el entorno se mueve estando quieto. Es la forma más frecuente de vértigo. Benigno significa que la causa del trastorno no es grave. Es más frecuente en adultos mayores. °CAUSAS  °Es el resultado de un trastorno en el sistema laberíntico. Es una zona en el oído medio que ayuda a controlar el equilibrio. La causa puede ser una infección viral, una lesión en la cabeza o un movimiento repetitivo. Sin embargo, a menudo no se halla causa.  °SÍNTOMAS  °Los síntomas de vértigo posicional benigno se producen al mover la cabeza o los ojos en diferentes direcciones. Algunos de los síntomas pueden ser:  °· Pérdida de equilibrio y caídas. °· Vómitos. °· Visión borrosa. °· Mareos. °· Náuseas. °· Movimientos oculares involuntarios (nistagmus). °DIAGNÓSTICO  °El vértigo postural benigno se diagnostica mediante un examen físico. Si la causa específica de su vértigo posicional benigno es desconocido, su médico puede indicar diagnósticos por imágenes, como la resonancia magnética (RM) o la tomografía computada (TC).  °TRATAMIENTO  °El médico le podrá recomendar movimientos o procedimientos para corregir el vértigo posicional benigno. Para tratar los síntomas pueden indicarse medicamentos como meclizina, benzodiazepinas y medicamentos para las náuseas. En casos raros, si los síntomas son causados   por ciertos trastornos que afectan el oído interno, es posible que necesite cirugía.  °INSTRUCCIONES PARA EL CUIDADO EN EL HOGAR  °· Siga las indicaciones del médico. °· Muévase lentamente. No haga movimientos bruscos con la cabeza ni el cuerpo. °· Evite conducir vehículos. °· Evite operar maquinarias pesadas. °· Evite realizar tareas que serían peligrosas para usted u otras personas durante un episodio de vértigo. °· Debe ingerir gran cantidad de líquido para mantener la orina de tono claro o color amarillo pálido. °SOLICITE ATENCIÓN MÉDICA DE INMEDIATO  SI:  °· Tiene dificultad para hablar, caminar, siente debilidad o tiene problemas para usar los brazos, las manos o las piernas. °· Tiene dificultad para respirar. °· Sufre un dolor de cabeza intenso. °· Las náuseas o los vómitos persisten o empeoran. °· Tiene cambios en la visión. °· Sus familiares o amigos notan cambios en su conducta. °· El dolor empeora. °· Tiene fiebre. °· Comienza a sentir rigidez en el cuello o sensibilidad a la luz. °ASEGÚRESE DE QUE:  °· Comprende estas instrucciones. °· Controlará su enfermedad. °· Solicitará ayuda de inmediato si no mejora o si empeora. °Document Released: 12/21/2008 Document Revised: 11/27/2011 °ExitCare® Patient Information ©2015 ExitCare, LLC. This information is not intended to replace advice given to you by your health care provider. Make sure you discuss any questions you have with your health care provider. ° °

## 2014-12-09 ENCOUNTER — Encounter: Payer: Self-pay | Admitting: Gynecology

## 2014-12-09 ENCOUNTER — Other Ambulatory Visit: Payer: Self-pay | Admitting: Gynecology

## 2014-12-09 ENCOUNTER — Ambulatory Visit (INDEPENDENT_AMBULATORY_CARE_PROVIDER_SITE_OTHER): Payer: 59 | Admitting: Gynecology

## 2014-12-09 VITALS — BP 128/86 | Ht 63.0 in | Wt 204.0 lb

## 2014-12-09 DIAGNOSIS — T7840XA Allergy, unspecified, initial encounter: Secondary | ICD-10-CM | POA: Diagnosis not present

## 2014-12-09 DIAGNOSIS — Z01419 Encounter for gynecological examination (general) (routine) without abnormal findings: Secondary | ICD-10-CM | POA: Diagnosis not present

## 2014-12-09 DIAGNOSIS — B9689 Other specified bacterial agents as the cause of diseases classified elsewhere: Secondary | ICD-10-CM

## 2014-12-09 DIAGNOSIS — L292 Pruritus vulvae: Secondary | ICD-10-CM

## 2014-12-09 DIAGNOSIS — R21 Rash and other nonspecific skin eruption: Secondary | ICD-10-CM

## 2014-12-09 DIAGNOSIS — Z1159 Encounter for screening for other viral diseases: Secondary | ICD-10-CM

## 2014-12-09 DIAGNOSIS — D573 Sickle-cell trait: Secondary | ICD-10-CM | POA: Diagnosis not present

## 2014-12-09 DIAGNOSIS — N760 Acute vaginitis: Secondary | ICD-10-CM | POA: Diagnosis not present

## 2014-12-09 DIAGNOSIS — A499 Bacterial infection, unspecified: Secondary | ICD-10-CM

## 2014-12-09 LAB — COMPREHENSIVE METABOLIC PANEL
ALK PHOS: 102 U/L (ref 39–117)
ALT: 58 U/L — ABNORMAL HIGH (ref 0–35)
AST: 30 U/L (ref 0–37)
Albumin: 3.9 g/dL (ref 3.5–5.2)
BILIRUBIN TOTAL: 0.5 mg/dL (ref 0.2–1.2)
BUN: 10 mg/dL (ref 6–23)
CO2: 23 mEq/L (ref 19–32)
Calcium: 9.4 mg/dL (ref 8.4–10.5)
Chloride: 104 mEq/L (ref 96–112)
Creat: 0.93 mg/dL (ref 0.50–1.10)
GLUCOSE: 102 mg/dL — AB (ref 70–99)
POTASSIUM: 4.4 meq/L (ref 3.5–5.3)
SODIUM: 139 meq/L (ref 135–145)
Total Protein: 6.9 g/dL (ref 6.0–8.3)

## 2014-12-09 LAB — CBC WITH DIFFERENTIAL/PLATELET
BASOS ABS: 0 10*3/uL (ref 0.0–0.1)
BASOS PCT: 0 % (ref 0–1)
EOS ABS: 0.2 10*3/uL (ref 0.0–0.7)
EOS PCT: 4 % (ref 0–5)
HCT: 43.8 % (ref 36.0–46.0)
Hemoglobin: 14.7 g/dL (ref 12.0–15.0)
Lymphocytes Relative: 25 % (ref 12–46)
Lymphs Abs: 1.5 10*3/uL (ref 0.7–4.0)
MCH: 28.5 pg (ref 26.0–34.0)
MCHC: 33.6 g/dL (ref 30.0–36.0)
MCV: 85 fL (ref 78.0–100.0)
MPV: 9.3 fL (ref 8.6–12.4)
Monocytes Absolute: 0.5 10*3/uL (ref 0.1–1.0)
Monocytes Relative: 9 % (ref 3–12)
NEUTROS PCT: 62 % (ref 43–77)
Neutro Abs: 3.6 10*3/uL (ref 1.7–7.7)
PLATELETS: 276 10*3/uL (ref 150–400)
RBC: 5.15 MIL/uL — ABNORMAL HIGH (ref 3.87–5.11)
RDW: 13.4 % (ref 11.5–15.5)
WBC: 5.8 10*3/uL (ref 4.0–10.5)

## 2014-12-09 LAB — LIPID PANEL
Cholesterol: 179 mg/dL (ref 0–200)
HDL: 52 mg/dL (ref >=46)
LDL CALC: 104 mg/dL — AB (ref 0–99)
Total CHOL/HDL Ratio: 3.4 Ratio
Triglycerides: 115 mg/dL (ref <150)
VLDL: 23 mg/dL (ref 0–40)

## 2014-12-09 LAB — WET PREP FOR TRICH, YEAST, CLUE
Trich, Wet Prep: NONE SEEN
WBC, Wet Prep HPF POC: NONE SEEN
YEAST WET PREP: NONE SEEN

## 2014-12-09 LAB — TSH: TSH: 4.018 u[IU]/mL (ref 0.350–4.500)

## 2014-12-09 MED ORDER — TINIDAZOLE 500 MG PO TABS: 500.0000 mg | ORAL_TABLET | Freq: Once | ORAL | Status: DC

## 2014-12-09 MED ORDER — HYDROXYZINE HCL 10 MG PO TABS: 10.0000 mg | ORAL_TABLET | Freq: Three times a day (TID) | ORAL | Status: DC | PRN

## 2014-12-09 NOTE — Progress Notes (Signed)
Jasmine Campbell 1966-03-19 [id number]   History:    49 y.o.  for annual gyn exam with several complaints: #1 patient for past several days has complained of generalized pruritus. #2 patient complaining of vulvar irritation and pruritus for which she took over-the-counter antifungal ovules for 3 days with minimal improvement #3 patient has informing that she was diagnosed with sickle cell trait and did not understand disease or the trait and wanted explanation. #4 hyperglycemia last years sugar was at 110 had no follow-up #5 patient complaining of tiredness and fatigue #6  Patient complaining of being overweight #7 seasonal allergy, rhinitis  Patient stated she had a normal colonoscopy in 2015. She has not followed up with her primary care physician who had diagnosed with a fatty liver disease. She wanted to have her blood work drawn today.The patient in [place] have her IUD changed. 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  Gynecologic History Patient's last menstrual period was 12/08/2014. Contraception: IUD Last Pap: 2015. Results were: normal Last mammogram: 2015. Results were: normal but dense  Obstetric History OB History  Gravida Para Term Preterm AB SAB TAB Ectopic Multiple Living  # Outcome Date GA Lbr Len/2nd Weight Sex Delivery Anes PTL Lv  1 Term     M CS-Unspec   Y       ROS: A ROS was performed and pertinent positives and negatives are included in the history.  GENERAL: No fevers or chills. HEENT: No change in vision, no earache, sore throat or sinus congestion. NECK: No pain  or stiffness. CARDIOVASCULAR: No chest pain or pressure. No palpitations. PULMONARY: No shortness of breath, cough or wheeze. GASTROINTESTINAL: No abdominal pain, nausea, vomiting or diarrhea, melena or bright red blood per rectum. GENITOURINARY: No urinary frequency, urgency, hesitancy or dysuria. MUSCULOSKELETAL: No joint or muscle pain, no back pain, no recent trauma. DERMATOLOGIC: No rash, no itching, no lesions. ENDOCRINE: No polyuria, polydipsia, no heat or cold intolerance. No recent change in weight. HEMATOLOGICAL: No anemia or easy bruising or bleeding. NEUROLOGIC: No headache, seizures, numbness, tingling or weakness. PSYCHIATRIC: No depression, no loss of interest in normal activity or change in sleep pattern.     Exam: chaperone present  BP 128/86 mmHg  Ht  (1.6 m)  Wt 204 lb (92.534 kg)  BMI 36.15 kg/m2  LMP 12/08/2014  Body mass index is 36.15 kg/(m^2).  General appearance : Well developed well nourished female. No acute distress HEENT: Eyes: no retinal hemorrhage or exudates,  Neck supple, trachea midline, no carotid bruits, no thyroidmegaly Lungs: Clear to auscultation, no rhonchi or wheezes, or rib retractions  Heart: Regular rate and rhythm, no murmurs or gallops Breast:Examined in sitting and supine position were symmetrical in appearance, no palpable masses or tenderness,  no skin retraction, no nipple inversion, no nipple discharge, no skin discoloration, no axillary or supraclavicular lymphadenopathy Abdomen: no palpable masses or tenderness, no rebound or guarding Extremities: no edema or skin discoloration or tenderness  Pelvic:  Bartholin, Urethra, Skene Glands: Within normal limits  Vagina: No gross lesions or discharge, menstrual blood  Cervix: No gross lesions or discharge, IUD string seen  Uterus  anteverted, normal size, shape and consistency, non-tender and mobile  Adnexa  Without masses or tenderness  Anus and perineum  normal    Rectovaginal  normal sphincter tone without palpated masses or tenderness             Hemoccult colonoscopy less than 6 months ago   Wet prep positive amine, moderate clue cells, many bacteria  Assessment/Plan:  49 y.o. female for annual exam with a multitude of complaints today. #1 her generalized pruritus may be attributed something allergic in the environment which she will continue to monitor site just clothing, soaps, detergents, or perfumes or diet.  #2 rhinitis/seasonal allergy I will last to stop the Zyrtec for now I'm going to prescribe her Vistaril 10 mg 1 by mouth 3 times a day when necessary for this as well as for problem #1.  #3 for her weight it was recommended she began eating healthier and exercising.  #4 because of her tiredness and fatigue and weight gain were going to check her TSH  Also because her strong family history of diabetes and last year her blood sugar at 110 were going to check a comprehensive metabolic panel and see what her sugar is as well. For further screening we will check also her CBC along with her urinalysis.  #5 per CDC recommendation hepatitis C screen will be done today.  #326for her bacterial vaginosis she will be prescribed Tindamax 500 mg. She is to take 4 tablets today and repeat in 24 hours  #7 we had a discussion on sickle cell trait I presented her with a pedigree and explained to her that she is probably a carrier she has one son who has not been tested and is recommended that he be tested in the event that he is a carrier and would need to know this information before she gets married. We discussed the front modes of inheritance and probability of sickle cell trait.  #8. Pap smear not done today in accordance to the new guidelines.  #9 patient will be referred to primary care physician to manage her medical needs.  Additional time from routine annual exam was approximate 15 additional minutes.   Ok EdwardsFERNANDEZ,JUAN H MD, 9:04 AM  12/09/2014

## 2014-12-09 NOTE — Patient Instructions (Addendum)
Estudio de clulas falciformes (Sickle Cell Testing) El estudio de las clulas falciformes se [place] para Hydrographic surveyor la presencia del rasgo drepanoctico o para confirmar la presencia anemia drepanoctica. Algunas personas tienen ms predisposicin que otras a tener el rasgo drepanoctico o anemia drepanoctica. Se recomienda que estas personas se hagan el estudio. Debido a la gravedad de la enfermedad, aquellos con ms probabilidades de tener el rasgo y que estn planeando tener hijos deben buscar asesoramiento gentico. Tambin hay estudios prenatales disponibles.  Cuando una persona tiene el rasgo drepanoctico o anemia drepanoctica, sus glbulos rojos pueden tener forma de una hoz en lugar de la forma normal redondeada. Esto permite que las clulas queden atascadas en los vasos sanguneos y causen [place].  PREPARACIN PARA EL ESTUDIO Se obtiene [place] de sangre insertando una aguja en una vena del brazo. Jasmine Campbell se examina para detectar la presencia de hemoglobinaS (HbS) que causa el rasgo drepanoctico o anemia drepanoctica. En la anemia drepanoctica, se hereda un gen anmalo de cada uno de [place].  La prueba puede no detectar la hemoglobinaS en las siguientes circunstancias:  Le realizaron una transfusin de sangre en el trmino de los 3 o 21meses anteriores a la prueba de clulas falciformes.  Tiene la enfermedad de los glbulos rojos que aumenta la produccin de estos glbulos (policitemia).  Las pruebas se realizan a los bebs de menos de 22meses.  Estuvo tomando ciertos medicamentos, como fenotiacinas, cuando se realiz la prueba. HALLAZGOS NORMALES Negativo para hemoglobinaS (HbS). Los rangos para los resultados normales pueden variar entre diferentes laboratorios y hospitales. Consulte siempre con su mdico despus de Psychologist, counselling estudio para Armed forces logistics/support/administrative officer significado de los [place] y si los valores se consideran "dentro de los lmites normales". SIGNIFICADO DEL  ESTUDIO El mdico leer los resultados y Electrical engineer con usted sobre la importancia y el significado de los [place], as como de las opciones de tratamiento y la necesidad de Optometrist pruebas adicionales, si fuera necesario. OBTENCIN DE LOS RESULTADOS DE LAS PRUEBAS  Es su responsabilidad retirar el resultado del [place]. Consulte en el laboratorio cundo y cmo podr [employer]. Document Released: 06/25/2013 [facility] Patient Information 2015 [place]. This information is not intended to replace advice given to you by your health care provider. Make sure you discuss any questions you have with your health care provider. Anemia drepanoctica, adultos (Sickle Cell Anemia, Adult) La anemia drepanoctica es una enfermedad en la cual los glbulos rojos tiene la forma de una "hoz". Esta forma anormal acorta la vida de estas clulas, lo que da como resultado una concentracin anormal de glbulos rojos en la [place]. La forma de hoz tambin provoca que las clulas se agrupen y bloqueen el flujo de sangre en los vasos sanguneos. Como [place], los tejidos y rganos del cuerpo no reciben suficiente oxgeno. La anemia drepanoctica provoca dao a los rganos y aumenta el riesgo de infecciones. CAUSAS  La anemia drepanoctica es un trastorno gentico. Los que reciben [employer] copias del gen tienen la enfermedad, y los que reciben una copia tienen el rasgo drepanoctico. FACTORES DE RIESGO El gen drepanoctico es ms comn en personas cuyas familias provienen de frica. Otras zonas del mundo donde se produce el rasgo de clulas drepanocticas incluyen el [place], [place] y [place], el Caribe, y [place].  SIGNOS Y SNTOMAS  Dolor, especialmente en las extremidades, espalda, pecho o abdomen (ms comn). El dolor puede aparecer de repente o presentarse despus de una enfermedad, en especial si hay  deshidratacin. El dolor tambin puede aparecer por sobre esfuerzo o exposicin a  cambios de temperatura extremos.  Infecciones bacterianas graves frecuentes, especialmente ciertos tipos de neumona y meningitis.  Dolor e hinchazn de manos y pies.  Disminucin de [employer].   Prdida del apetito.   Cambios en la conducta.  Dolores de [place].  Convulsiones.  Falta de aire o dificultades respiratorias.  Cambios en la visin.  lceras en la piel. Los que tienen el rasgo pueden no tener sntomas o pueden tener sntomas leves.  DIAGNSTICO  La anemia drepanoctica se diagnostica con anlisis de sangre que demuestran el rasgo gentico. Con frecuencia se diagnostica durante el perodo de recin nacido, debido a las pruebas obligatorias en todo el pas. Podrn realizarle una variedad de anlisis de [place], rayos X, tomografas, resonancias magnticas, ecografas y [place] de funcin pulmonar para controlar la enfermedad. TRATAMIENTO  La anemia drepanoctica tambin puede tratarse con:  Medicamentos. Usted puede recibir medicamentos para Conservation officer, historic buildings, medicamentos antibiticos (para tratar y Chemical engineer las infecciones) o medicamentos para aumentar la produccin de ciertos tipos de hemoglobina.  Lquidos.  Oxgeno.  Transfusiones de [place]. INSTRUCCIONES PARA EL CUIDADO EN EL HOGAR   Debe ingerir los lquidos suficientes para mantener la orina de tono claro o color amarillo plido. Aumente su ingesta de lquidos en poca de calor y durante el ejercicio.  No fume. Fumar disminuye los niveles de oxgeno en la [place].   Slo tome medicamentos de venta libre o recetados para Glass blower/designer, Health and safety inspector o bajar la fiebre, segn las indicaciones de su mdico.  Tome todos los medicamentos como le indic el mdico. Finalice la prescripcin [place], aunque se sienta mejor.   Tome los suplementos segn las indicaciones del mdico.   Considere usar una pulsera de alerta mdica. En sta se informa sobre su afeccin a las personas que deban atenderlo en caso de Risk analyst.   Cuando viaje, lleve en todo momento su informe mdico, el nombre del los profesionales que lo asisten y los medicamentos que toma.   Si presenta fiebre, no tome medicamentos para bajarla de inmediato. Puede ocultar un problema que est en curso. Comunqueselo al profesional que lo asiste.  [place]um[place]a con todas las visitas de control, segn le indique su mdico. La anemia drepanoctica requiere un cuidado mdico regular. SOLICITE ATENCIN MDICA SI: Jaclynn Guarneri. SOLICITE ATENCIN MDICA DE INMEDIATO SI:   Se siente mareado o sufre un desmayo.   Tiene dolor abdominal, especialmente en la zona izquierda, cerca del estmago.   Presenta una ereccin persistente, incmoda y dolorosa (priapismo). si no se trata de inmediato, esto podra llevar a desarrollar impotencia.   Presenta adormecimiento en brazos o piernas o tiene dificultades para moverlos.   Dificultad para hablar.   Tiene fiebre o sntomas persistentes durante ms de 2 - 3 das.   Tiene fiebre y los sntomas empeoran repentinamente.   Tiene signos o sntomas de infeccin. Ellos son:  Escalofros.   Cansancio fuera de lo comn (letargo).   Irritabilidad.   Falta de apetito.   Vmitos.   Siente un dolor intenso y no lo [employer].   Le falta el aire.  Siente dolor en el pecho.   Con la tos elimina esputo similar a pus o con sangre.   Siente rigidez en el cuello.  Tiene los pies o las manos hinchados o Tree surgeon.  Tiene el abdomen hinchado.  Siente dolor en las articulaciones. ASEGRESE DE QUE:  Comprende estas instrucciones. Document Released:  12/01/2008 Document Revised: 01/19/2014 ExitCare Patient Information 2015 [place], [place]. This information is not intended to replace advice given to you by your health care provider. Make sure you discuss any questions you have with your health care provider.  Hydroxyzine capsules or tablets Qu es este medicamento? La  HIDROXIZINA es un antihistamnico. Este medicamento se [place] para tratar los sntomas de [place]. Tambin se [place] para tratar la ansiedad y tensin. Se puede Contractor en combinacin con otros medicamentos para inducir el sueo antes de una operacin [place]. Este medicamento puede ser utilizado para otros usos; si tiene alguna pregunta consulte con su proveedor de atencin mdica o con su farmacutico. MARCAS COMERCIALES DISPONIBLES: ANX, Atarax, Rezine, Vistaril Qu le debo informar a mi profesional de la salud antes de tomar este medicamento? Necesita saber si usted presenta alguno de los siguientes problemas o situaciones: -otras enfermedades crnicas -dificultad para Garment/textile technologist -glaucoma -enfermedad cardiaca -enfermedad renal -enfermedad heptica -enfermedad pulmonar -una reaccin alrgica o inusual a la hidroxizina, a la cetirizina, a otros medicamentos, alimentos, colorantes o conservantes -si est embarazada o buscando quedar embarazada -si est amamantando a un beb Cmo debo utilizar este medicamento? Tome este medicamento por va oral con un vaso lleno de agua. Siga las instrucciones de la etiqueta del [place]. Este medicamento se puede tomar con alimentos o con el [place] sus dosis a intervalos regulares. No tome su medicamento con una frecuencia mayor a la indicada. Hable con su pediatra para informarse acerca del uso de este medicamento en nios. Puede requerir atencin especial. Aunque este medicamento ha sido recetado a nios tan menores como de 6 aos de edad para condiciones selectivas, las precauciones se aplican. Los pacientes de ms de 65 aos de edad pueden presentar reacciones ms fuertes y Designer, industrial/product dosis [employer]. Sobredosis: Pngase en contacto inmediatamente con un centro toxicolgico o una sala de urgencia si usted cree que haya tomado demasiado medicamento. ATENCIN: [employer] es solo para usted. No comparta este  medicamento con nadie. Qu sucede si me olvido de una dosis? Si olvida una dosis, tmela lo antes posible. Si es casi la hora de la prxima dosis, tome slo esa dosis. No tome dosis adicionales o dobles. Qu puede interactuar con este medicamento? -alcohol -barbitricos para el sueo o para las convulsiones -medicamentos para resfros o Set designer -medicamentos para la depresin, ansiedad o trastornos emocionales -analgsicos -medicamentos para conciliar el sueo -relajantes musculares Puede ser que esta lista no menciona todas las posibles interacciones. Informe a su profesional de KB Home	[place] de [employer] Corporation productos a base de hierbas, medicamentos de [place] o suplementos nutritivos que est tomando. Si usted fuma, consume bebidas alcohlicas o si utiliza drogas ilegales, indqueselo tambin a su profesional de KB Home	[place]. Algunas sustancias pueden interactuar con su medicamento. A qu debo estar atento al usar Coca-Cola? Si los sntomas no mejoran, consulte a su mdico o a su profesional de KB Home	[place]. Puede experimentar mareos o somnolencia. No conduzca ni utilice maquinaria ni haga nada que Associate Professor en estado de alerta hasta que sepa cmo le afecta este medicamento. No se siente ni se ponga de pie con rapidez, especialmente si es un paciente de edad avanzada. Esto reduce el riesgo de mareos o [employer]. El alcohol puede interferir con el efecto de este medicamento. Evite consumir bebidas alcohlicas. Se le podr secar la boca. Masticar chicle sin azcar, chupar caramelos duros y tomar agua en abundancia le ayudar a mantener la boca hmeda. Si el problema no  desaparece o es severo, consulte a su mdico. Este medicamento puede resecarle los ojos y provocar visin borrosa. Si [place] lentes de contacto, puede sentir ciertas molestias. Las gotas lubricantes pueden ser tiles. Si el problema no desaparece o es severo, consulte a su mdico de los ojos. Si se est realizando pruebas  cutneas debido a una alergia, informe a su mdico que est recibiendo Coca-Cola. Qu efectos secundarios puedo tener al [employer] este medicamento? Efectos secundarios que debe informar a su mdico o a Barrister's clerk de la salud tan pronto como sea posible: -pulso cardiaco rpido o irregular -dificultad para orinar -convulsiones -hablar arrastrando las palabras o confusin -temblores Efectos secundarios que, por lo general, no requieren Geophysical data processor (debe informarlos a su mdico o a su profesional de la salud si persisten o si son molestos): -estreimiento -somnolencia -fatiga -dolor de cabeza -Higher education careers adviser Puede ser que esta lista no menciona todos los posibles efectos secundarios. Comunquese a su mdico por asesoramiento mdico [employer]. Usted puede informar los efectos secundarios a la FDA por telfono al 1-800-FDA-1088. Dnde debo guardar mi medicina? Mantngala fuera del alcance de los nios. Gurdela a [employer], entre 15 y 25 grados C (27 y 12 grados F). Mantenga el envase bien cerrado. Deseche todo el medicamento que no haya utilizado, despus de la fecha de vencimiento. ATENCIN: Este folleto es un resumen. Puede ser que no cubra toda la posible informacin. Si usted tiene preguntas acerca de esta medicina, consulte con su mdico, su farmacutico o su profesional de Technical sales engineer.  2015, Elsevier/Gold Standard. (2006-07-17 12:20:00) Tinidazole tablets Qu es este medicamento? El TINIDAZOL es un medicamento antiinfeccioso. Se utiliza para tratar la amebiasis, giardiasis, tricomonosis y vaginosis. No es efectivo para resfros, gripe u otras infecciones de origen viral. Este medicamento puede ser utilizado para otros usos; si tiene alguna pregunta consulte con su proveedor de atencin mdica o con su farmacutico. MARCAS COMERCIALES DISPONIBLES: [place] Qu le debo informar a mi profesional de la salud antes de tomar este  medicamento? Necesita saber si usted presenta alguno de los siguientes problemas o situaciones: -anemia u otros trastornos sanguneos -si consume bebidas alcohlicas con frecuencia -recibe hemodilisis -trastorno de convulsiones -una reaccin alrgica o inusual al tinidazol, a otros medicamentos, alimentos, colorantes o conservantes -si est embarazada o buscando quedar embarazada -si est amamantando a un beb Cmo debo utilizar este medicamento? Tome este medicamento por va oral con un vaso lleno de agua. Siga las instrucciones de la etiqueta del [place]. Tomar con alimentos. Tome sus dosis a intervalos regulares. No tome su medicamento con una frecuencia mayor a la indicada. Complete todas las dosis de su medicamento como se le haya indicado aun si se siente mejor. No omita ninguna dosis o suspenda el uso de su medicamento antes de lo indicado. Hable con su pediatra para informarse acerca del uso de este medicamento en nios. Aunque este medicamento ha sido recetado a nios tan menores como de 3 aos de edad para condiciones selectivas, las precauciones se aplican. Sobredosis: Pngase en contacto inmediatamente con un centro toxicolgico o una sala de urgencia si usted cree que haya tomado demasiado medicamento. ATENCIN: [employer] es solo para usted. No comparta este medicamento con nadie. Qu sucede si me olvido de una dosis? Si olvida una dosis, tmela lo antes posible. Si es casi la hora de la prxima dosis, tome slo esa dosis. No tome dosis adicionales o dobles. Qu puede interactuar con este medicamento? No tome esta medicina con ninguno  de los siguientes medicamentos: -alcohol o cualquier producto que contenga alcohol -solucin oral de amprenavir -disulfiram -inyeccin de paclitaxel -solucin oral de ritonavir -solucin oral de sertralina -inyeccin de sulfametoxasol-trimetoprima Esta medicina tambin puede interactuar con los siguientes  medicamentos: -colestiramina -cimetidina -conivaptn -ciclosporina -fluorouracilo -fosfenitona, fenitona -quetoconazol -litio -fenobarbital -tacrolimo -warfarina Puede ser que esta lista no menciona todas las posibles interacciones. Informe a su profesional de KB Home	[place] de [employer] Corporation productos a base de hierbas, medicamentos de [place] o suplementos nutritivos que est tomando. Si usted fuma, consume bebidas alcohlicas o si utiliza drogas ilegales, indqueselo tambin a su profesional de KB Home	[place]. Algunas sustancias pueden interactuar con su medicamento. A qu debo estar atento al usar Coca-Cola? Si los sntomas no mejoran o si empeoran, consulte con su mdico o con su profesional de KB Home	[place]. Evite consumir las bebidas alcohlicas mientras toma este medicamento y durante tres das despus. El alcohol puede hacerle sentir mareado, enfermo o enrojecimiento. Si est recibiendo tratamiento para [place] enfermedad de transmisin sexual, evite todo contacto sexual hasta que haya terminado el [place]. Es posible que su pareja tambin necesite [place]. Qu efectos secundarios puedo tener al [employer] este medicamento? Efectos secundarios que debe informar a su mdico o a Barrister's clerk de la salud tan pronto como sea posible: -Chief of Staff como erupcin cutnea, picazn o urticarias, hinchazn de la cara, labios o lengua -problemas respiratorios -confusin, depresin -manchas oscuras o blancas en la boca -sensacin de desmayos o mareos, cadas -fiebre, infeccin -entumecimiento, hormigueo, dolor o debilidad en las manos o pies -dolor al orinar -convulsiones -cansancio o debilidad inusual -irritacin o flujo vaginal -vmito Efectos secundarios que, por lo general, no requieren atencin mdica (debe informarlos a su mdico o a su profesional de la salud si persisten o si son molestos): -orina de color marrn oscuro o rojo -diarrea -dolor de cabeza -prdida  del apetito -sabor metlico -nuseas -Higher education careers adviser Puede ser que esta lista no menciona todos los posibles efectos secundarios. Comunquese a su mdico por asesoramiento mdico [employer]. Usted puede informar los efectos secundarios a la FDA por telfono al 1-800-FDA-1088. Dnde debo guardar mi medicina? Mantngala fuera del alcance de los nios. Gurdela a [employer], entre 15 y 15 grados C (77 y 65 grados F). Protjala de la luz y de la humedad. Mantenga el envase bien cerrado. Deseche todo el medicamento que no haya utilizado, despus de la fecha de vencimiento. ATENCIN: Este folleto es un resumen. Puede ser que no cubra toda la posible informacin. Si usted tiene preguntas acerca de esta medicina, consulte con su mdico, su farmacutico o su profesional de Technical sales engineer.  2015, Elsevier/Gold Standard. (2007-04-14 16:39:00) Vaginosis bacteriana (Bacterial Vaginosis) La vaginosis bacteriana es una infeccin de la vagina. Se produce cuando una cantidad excesiva de ciertos grmenes (bacterias) crece en la vagina. [place] los medicamentos tal como se lo indic su mdico.  Finalice la prescripcin completa, aunque comience a sentirse mejor.  No mantenga relaciones sexuales hasta que finalice sus medicamentos y se International aid/development worker.  Comunique a sus compaeros sexuales que sufre una infeccin. Deben consultar a su mdico para iniciar un tratamiento.  Practique el sexo seguro. Use preservativos. Tenga solo un compaero sexual. SOLICITE AYUDA SI:  No mejora luego de 3 das de [place].  Observa una secrecin (prdida) de color gris ms abundante que proviene de la vagina.  Siente ms dolor que antes.  Tiene fiebre. ASEGRESE DE QUE:   Comprende estas instrucciones.  Controlar su afeccin.  Recibir ayuda de inmediato si no mejora o si empeora. Document Released: 12/01/2008 Document Revised: 06/25/2013 [facility] Patient  Information 2015 [place]. This information is not intended to replace advice given to you by your health care provider. Make sure you discuss any questions you have with your health care provider.

## 2014-12-10 LAB — URINALYSIS W MICROSCOPIC + REFLEX CULTURE
Bacteria, UA: NONE SEEN
Bilirubin Urine: NEGATIVE
CASTS: NONE SEEN
CRYSTALS: NONE SEEN
Glucose, UA: NEGATIVE mg/dL
Ketones, ur: NEGATIVE mg/dL
NITRITE: NEGATIVE
PH: 5.5 (ref 5.0–8.0)
Protein, ur: NEGATIVE mg/dL
RBC / HPF: >50 RBC/hpf — AB (ref <3)
SPECIFIC GRAVITY, URINE: 1.012 (ref 1.005–1.030)
Urobilinogen, UA: 0.2 mg/dL (ref 0.0–1.0)

## 2014-12-10 LAB — HEPATITIS C ANTIBODY: HCV AB: NEGATIVE

## 2014-12-12 LAB — URINE CULTURE: Colony Count: >=100000

## 2014-12-14 ENCOUNTER — Other Ambulatory Visit: Payer: Self-pay | Admitting: Gynecology

## 2014-12-14 DIAGNOSIS — R7401 Elevation of levels of liver transaminase levels: Secondary | ICD-10-CM

## 2014-12-14 DIAGNOSIS — R740 Nonspecific elevation of levels of transaminase and lactic acid dehydrogenase [LDH]: Principal | ICD-10-CM

## 2014-12-14 LAB — HEPATITIS PANEL, ACUTE
HCV Ab: NEGATIVE
HEP B C IGM: NONREACTIVE
Hep A IgM: NONREACTIVE
Hepatitis B Surface Ag: NEGATIVE

## 2014-12-14 MED ORDER — NITROFURANTOIN MONOHYD MACRO 100 MG PO CAPS: 100.0000 mg | ORAL_CAPSULE | Freq: Two times a day (BID) | ORAL | Status: DC

## 2015-01-11 ENCOUNTER — Other Ambulatory Visit: Payer: 59

## 2015-01-11 DIAGNOSIS — R740 Nonspecific elevation of levels of transaminase and lactic acid dehydrogenase [LDH]: Principal | ICD-10-CM

## 2015-01-11 DIAGNOSIS — R7401 Elevation of levels of liver transaminase levels: Secondary | ICD-10-CM

## 2015-01-11 LAB — AST: AST: 19 U/L (ref 0–37)

## 2015-01-11 LAB — ALT: ALT: 17 U/L (ref 0–35)

## 2015-02-02 ENCOUNTER — Other Ambulatory Visit: Payer: Self-pay | Admitting: Gynecology

## 2015-02-02 ENCOUNTER — Other Ambulatory Visit: Payer: 59

## 2015-02-02 DIAGNOSIS — N3001 Acute cystitis with hematuria: Secondary | ICD-10-CM

## 2015-02-03 LAB — URINALYSIS W MICROSCOPIC + REFLEX CULTURE
Bacteria, UA: NONE SEEN
Bilirubin Urine: NEGATIVE
CASTS: NONE SEEN
Crystals: NONE SEEN
Glucose, UA: NEGATIVE mg/dL
Ketones, ur: NEGATIVE mg/dL
NITRITE: NEGATIVE
Protein, ur: NEGATIVE mg/dL
SPECIFIC GRAVITY, URINE: 1.017 (ref 1.005–1.030)
Squamous Epithelial / LPF: NONE SEEN
Urobilinogen, UA: 0.2 mg/dL (ref 0.0–1.0)
pH: 6 (ref 5.0–8.0)

## 2015-02-04 LAB — URINE CULTURE: Colony Count: 75000

## 2015-03-08 ENCOUNTER — Ambulatory Visit (INDEPENDENT_AMBULATORY_CARE_PROVIDER_SITE_OTHER): Payer: 59 | Admitting: Family Medicine

## 2015-03-08 VITALS — BP 122/68 | HR 75 | Temp 97.7°F | Resp 17 | Ht 64.0 in | Wt 209.0 lb

## 2015-03-08 DIAGNOSIS — H8110 Benign paroxysmal vertigo, unspecified ear: Secondary | ICD-10-CM | POA: Diagnosis not present

## 2015-03-08 MED ORDER — PROMETHAZINE HCL 12.5 MG PO TABS: 12.5000 mg | ORAL_TABLET | Freq: Three times a day (TID) | ORAL | Status: DC | PRN

## 2015-03-08 MED ORDER — MECLIZINE HCL 25 MG PO TABS: 25.0000 mg | ORAL_TABLET | Freq: Three times a day (TID) | ORAL | Status: DC | PRN

## 2015-03-08 NOTE — Patient Instructions (Addendum)
Benign Positional Vertigo Vertigo means you feel like you or your surroundings are moving when they are not. Benign positional vertigo is the most common form of vertigo. Benign means that the cause of your condition is not serious. Benign positional vertigo is more common in older adults. CAUSES  Benign positional vertigo is the result of an upset in the labyrinth system. This is an area in the middle ear that helps control your balance. This may be caused by a viral infection, head injury, or repetitive motion. However, often no specific cause is found. SYMPTOMS  Symptoms of benign positional vertigo occur when you move your head or eyes in different directions. Some of the symptoms may include:  Loss of balance and falls.  Vomiting.  Blurred vision.  Dizziness.  Nausea.  Involuntary eye movements (nystagmus). DIAGNOSIS  Benign positional vertigo is usually diagnosed by physical exam. If the specific cause of your benign positional vertigo is unknown, your caregiver may perform imaging tests, such as magnetic resonance imaging (MRI) or computed tomography (CT). TREATMENT  Your caregiver may recommend movements or procedures to correct the benign positional vertigo. Medicines such as meclizine, benzodiazepines, and medicines for nausea may be used to treat your symptoms. In rare cases, if your symptoms are caused by certain conditions that affect the inner ear, you may need surgery. HOME CARE INSTRUCTIONS   Follow your caregiver's instructions.  Move slowly. Do not make sudden body or head movements.  Avoid driving.  Avoid operating heavy machinery.  Avoid performing any tasks that would be dangerous to you or others during a vertigo episode.  Drink enough fluids to keep your urine clear or pale yellow. SEEK IMMEDIATE MEDICAL CARE IF:   You develop problems with walking, weakness, numbness, or using your arms, hands, or legs.  You have difficulty speaking.  You develop  severe headaches.  Your nausea or vomiting continues or gets worse.  You develop visual changes.  Your family or friends notice any behavioral changes.  Your condition gets worse.  You have a fever.  You develop a stiff neck or sensitivity to light. MAKE SURE YOU:   Understand these instructions.  Will watch your condition.  Will get help right away if you are not doing well or get worse. Document Released: 06/12/2006 Document Revised: 11/27/2011 Document Reviewed: 05/25/2011 [facility] Patient Information 2015 [place], [place]. This information is not intended to replace advice given to you by your health care provider. Make sure you discuss any questions you have with your health care provider. Vrtigo postural benigno (Benign Positional Vertigo)  Vrtigo es la sensacin de que el entorno se mueve estando quieto. Es la forma ms frecuente de vrtigo. Benigno significa que la causa del trastorno no es grave. Es ms frecuente en adultos mayores. CAUSAS  Es el resultado de un trastorno en el sistema laberntico. Es una zona en el odo medio que ayuda a controlar el equilibrio. La causa puede ser una infeccin viral, una lesin en la cabeza o un movimiento repetitivo. Sin embargo, a menudo no se Barrister's clerk.  SNTOMAS  Los sntomas de vrtigo posicional benigno se producen al mover la cabeza o los ojos en diferentes direcciones. Algunos de los sntomas pueden ser:   Prdida de equilibrio y cadas.  Vmitos.  Visin borrosa.  Mareos.  Nuseas.  Movimientos oculares involuntarios (nistagmus). DIAGNSTICO  El vrtigo postural benigno se diagnostica mediante un examen fsico. Si la causa especfica de su vrtigo posicional benigno es desconocido, su mdico puede indicar diagnsticos  por imgenes, como la Health visitor (RM) o la tomografa computada (TC).  TRATAMIENTO  El [employer] podr recomendar movimientos o procedimientos para corregir el vrtigo posicional benigno. Para  tratar los sntomas pueden indicarse medicamentos como meclizina, benzodiazepinas y medicamentos para las nuseas. En casos raros, si los sntomas son causados   por ciertos trastornos que afectan el odo interno, es posible que necesite [place].  INSTRUCCIONES PARA EL CUIDADO EN EL HOGAR   Siga las indicaciones del mdico.  Muvase lentamente. No haga movimientos bruscos con la cabeza ni el cuerpo.  Evite conducir vehculos.  Evite operar maquinarias pesadas.  Evite realizar tareas que seran peligrosas para usted u otras personas durante un episodio de vrtigo.  Debe ingerir gran cantidad de lquido para mantener la orina de tono claro o color amarillo plido. SOLICITE ATENCIN MDICA DE INMEDIATO SI:   Tiene dificultad para hablar, caminar, siente debilidad o tiene problemas para usar los [address], las manos o las piernas.  Tiene dificultad para respirar.  Sufre un dolor de cabeza intenso.  Las nuseas o los vmitos persisten o [place].  Tiene cambios en la visin.  Sus familiares o amigos notan cambios en su conducta.  El dolor [place].  Tiene fiebre.  Comienza a sentir rigidez en el cuello o sensibilidad a la luz. ASEGRESE DE QUE:   Comprende estas instrucciones.  Controlar su enfermedad.  Solicitar ayuda de inmediato si no mejora o si empeora. Document Released: 12/21/2008 Document Revised: 11/27/2011 [facility] Patient Information 2015 [place], [place]. This information is not intended to replace advice given to you by your health care provider. Make sure you discuss any questions you have with your health care provider.

## 2015-03-08 NOTE — Progress Notes (Signed)
This a 49 year old woman who works in Stage manager at [employer]. She's married and she comes in for vomiting without diarrhea.   Patient was in her usual state of health until she woke up this morning with vomiting at about 5:30. She's had a few cramps but no diarrhea.   Patient has a history of vertigo. She's been treated in the past with antiemetics as well as Zofran. She's been taking the Zofran this morning without success. She last vomited in our waiting room.    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  Patient states that she has dizziness whenever she moves. It does not make a difference whether she is lying or sitting. She denies lightheadedness when getting up quickly.    Patient has a history of vertigo in the past several years. She's had to take medicine and get evaluated for this 4.  Objective: overweight woman in no acute distress BP 122/68 mmHg  Pulse 75  Temp(Src) 97.7 F (36.5 C) (Oral)  Resp 17  Ht 5\' 4"  (1.626 m)  Wt 209 lb (94.802 kg)  BMI 35.86 kg/m2  SpO2 97%  LMP 03/05/2015  I checked the blood pressure lying and sitting and each time the pressure was approximately 120/60.  Patient's general color is mild pallor  HEENT: unremarkable except for scars on her TMs which do show some thickening. Mucous membranes are moist. Extraocular movement is normal without nystagmus. Neck: Supple no adenopathy Heart: Regular no murmur Chest: Clear Abdomen: hyperactive bowel sounds, mildly tender in the left upper quadrant , no HSM or mass Skin: Nonicteric , no rash. Patient does have some spider veins in the left medial thigh area  Extremities: moving 4 extremities equally without edema.  Jasmine Campbell was seen today for nausea, emesis and dizziness.  Diagnoses and all orders for this visit:  Benign paroxysmal positional vertigo, unspecified laterality Orders: -      meclizine (ANTIVERT) 25 MG tablet; Take 1 tablet (25 mg total) by mouth 3 (three) times daily as needed for dizziness. -     promethazine (PHENERGAN) 12.5 MG tablet; Take 1 tablet (12.5 mg total) by mouth every 8 (eight) hours as needed for nausea or vomiting.   patient was seen with her husband. I've asked them to return if she's not feeling better today. If symptoms continue then I would like her to return before Wednesday.   Signed, Sheila Oats.D.

## 2015-06-16 ENCOUNTER — Other Ambulatory Visit: Payer: Self-pay

## 2015-06-16 DIAGNOSIS — Z1231 Encounter for screening mammogram for malignant neoplasm of breast: Secondary | ICD-10-CM

## 2015-07-20 ENCOUNTER — Ambulatory Visit
Admission: RE | Admit: 2015-07-20 | Discharge: 2015-07-20 | Disposition: A | Discharge status: Home / Self Care | Payer: 59 | Source: Ambulatory Visit

## 2015-07-20 DIAGNOSIS — Z1231 Encounter for screening mammogram for malignant neoplasm of breast: Secondary | ICD-10-CM

## 2015-08-03 ENCOUNTER — Ambulatory Visit (INDEPENDENT_AMBULATORY_CARE_PROVIDER_SITE_OTHER): Payer: 59 | Admitting: Internal Medicine

## 2015-08-03 VITALS — BP 118/80 | HR 80 | Temp 98.1°F | Resp 16 | Ht 62.0 in | Wt 216.6 lb

## 2015-08-03 DIAGNOSIS — Z0000 Encounter for general adult medical examination without abnormal findings: Secondary | ICD-10-CM | POA: Diagnosis not present

## 2015-08-03 DIAGNOSIS — Z23 Encounter for immunization: Secondary | ICD-10-CM

## 2015-08-03 NOTE — Patient Instructions (Signed)
Calendario de vacunacin - Adultos  (Immunization Schedule, Adult)  [place] antigripal.  Todos los adultos deben vacunarse todos los [place].  [employer], incluidas las mujeres embarazadas y las personas con urticaria por alergia al huevo pueden recibir la vacuna antigripal inactivada (IIV).  Los [employer] 18 y 81 aos pueden recibir la vacuna contra la influenza recombinante (RIV). La vacuna RIV no contiene protenas del huevo.  Los adultos de 65 aos o ms pueden recibir la dosis de IIV estndar o IIV en dosis altas.  Vacuna contra difteria, ttanos y tos Dietitian (Td,Tdap).  Las mujeres embarazadas deben recibir 1 dosis de vacuna Tdap en cada embarazo. La dosis debe aplicarse independientemente del tiempo transcurrido desde la ltima dosis. Se prefiere la vacunacin entre las semanas 27 y 38 de gestacin.  Un adulto que no ha recibido Garment/textile technologist vacuna Tdap o que no sabe su estado de vacunacin deben recibir 1 dosis. Esta dosis inicial debe ser seguida por una dosis de refuerzo de toxoides tetnico y diftrico (Td) cada 10 aos.  Los adultos con una historia desconocida o incompleta de vacunacin de 3 dosis de las vacunas Td deben comenzar o completar una serie de vacunas primaria incluyendo una dosis de Tdap.  Los adultos deben recibir una dosis de refuerzo de Td cada 10 aos.  Vacuna contra la varicela.  Todos los adultos sin evidencia de inmunidad a la varicela deben recibir 2 dosis o una segunda dosis si slo han recibido 1 dosis.  Las mujeres embarazadas que no tengan evidencia de inmunidad deben recibir la primera dosis despus del Media planner. La primera dosis debe aplicarse antes de abandonar el establecimiento sanitario. La segunda dosis debe aplicarse de 4 a 8 semanas despus de la primera dosis.  Vacuna contra el virus del Engineer, technical sales (VPH).  Las mujeres de 13 a 26 aos que no han recibido la vacuna previamente deben recibir Ardelia Mems serie de 3 dosis.  La  vacuna no se recomienda en mujeres embarazadas. Sin embargo, no es Chartered loss adjuster una prueba de [place] antes de recibir una dosis. Si se comprueba que una mujer est embarazada despus de haber recibido Ardelia Mems dosis, no necesita tratamiento. En ese caso, las dosis restantes deben retrasarse hasta despus del embarazo.  Los hombres de 13 a 21 aos que no han recibido la vacuna previamente deben recibir Ardelia Mems serie de 3 dosis. Los [employer] 22 y 16 aos deben vacunarse.  Se recomienda [employer] [address] a todos los hombres que tengan sexo con hombres y que no recibieron ninguna dosis antes.  Se recomienda la aplicacin de la vacuna a cualquier persona con una enfermedad por inmunodeficiencia [employer] 26 aos, si no recibi [place] o ninguna de las dosis [place].  Durante la serie de 3 dosis, la segunda dosis debe aplicarse de 4 a 8 semanas despus de la primera dosis. La tercera dosis debe aplicarse 24 semanas despus de la primera dosis y 16 semanas despus de la segunda dosis.  Vacuna contra el herpes zoster.  Se recomienda una dosis en adultos mayores de 86 aos a menos que sufran ciertas enfermedades.  Vacuna triple viral (sarampin, paperas y [place]) o MMR por su siglas en ingls.  Los adultos nacidos antes de 1957 generalmente se consideran inmunes al sarampin y las paperas.  Los adultos nacidos en 1957 o ms tarde deben recibir 1 o ms dosis de la vacuna MMR, a menos que [employer] contraindicacin para la vacuna o que tengan prueba de  inmunidad a las tres enfermedades.  Los estudiantes que asisten a escuelas de educacin superior, los trabajadores de la salud o los viajeros internacionales deben aplicarse una segunda dosis de rutina de la vacuna MMR por lo menos 28 das despus de la primera dosis.  Las personas que recibieron la vacuna inactivada contra el sarampin o un tipo desconocido de vacuna contra el sarampin durante 1963 y1967 deben recibir 2 dosis de la  vacuna MMR.  Las personas que recibieron la vacuna inactivada contra las paperas o un tipo desconocido de vacuna contra las paperas antes de 1979 y se encuentran en alto riesgo de infeccin por paperas deben considerar la vacunacin con 2 dosis de la vacuna MMR.  En las mujeres en edad frtil, debe determinarse la inmunidad contra la rubola. Si no hay prueba de inmunidad, las mujeres que no estn embarazadas deben vacunarse. Si no hay prueba de inmunidad, las mujeres que estn embarazadas deben posponer la vacunacin hasta despus de su embarazo.  Los trabajadores de la salud no vacunados nacidos antes de 1957 que no tengan evidencia de laboratorio de sarampin de inmunidad a las paperas o rubola o la confirmacin de laboratorio de la enfermedad deben considerar vacunarse contra el sarampin y las paperas con 2 dosis de la vacuna MMR o vacunarse contra la rubola con 1 dosis de la vacuna MMR.  Vacuna antineumoccica conjugada 13 valente (PCV13).  Cuando est indicado, una persona que tenga dudas sobre su historial de vacunacin y no tenga antecedentes de vacunacin, debe recibir la vacuna PCV13.  Un adulto de19 aos o ms, que sufra ciertas enfermedades y no haya sido vacunado previamente deben recibir 1 dosis de la vacuna PCV13. Despus de la PCV13 debe aplicarse una dosis de la vacuna antineumoccica de polisacridos (PPSV23). La dosis de la vacuna PPSV23 se debe aplicar por lo menos 8 semanas despus de la dosis de la vacuna PCV13.  Un adulto de 19 aos o ms que sufra ciertas enfermedades y que haya recibido anteriormente1 o ms dosis de la vacuna PPSV23 debe recibir 1 dosis de PCV13. La dosis de la vacuna PCV13 se debe aplicar 1 o ms aos despus de la ltima dosis de la vacuna PPSV23.  Vacuna antineumoccica de polisacridos (PPSV23).  Cuando se indica la vacuna PCV13, debe aplicarse primero.  Todos los adultos de 65 aos o mayores deben recibir 1 dosis.  Los adultos menores de 65 aos  que sufran ciertas enfermedades, deben vacunarse.  Cualquier persona que resida en un hogar de ancianos o centro de atencin permanente debe vacunarse.  Un fumador adulto debe vacunarse.  Las personas con ciertas enfermedades por inmunodeficiencia y otras enfermedades deben recibir tanto la PCV13 como la PPSV23.  Las personas con el virus de inmunodeficiencia humana (VIH ) deben vacunarse tan pronto como sea posible despus del diagnstico.  La vacunacin durante la quimioterapia o la terapia de radiacin debe evitarse.  No se recomienda el uso rutinario de la vacuna PPSV23 en los indoamericanos, los nativos de [place] o personas menores de 65 aos excepto que existan condiciones mdicas que requieren la vacuna PPSV23.  Cuando est indicado, las personas que no saben si han sido vacunadas y no tienen antecedentes de vacunacin deben recibir la vacuna PPSV23.  Se recomienda a las personas entre 19 y 64 aos con insuficiencia renal crnica, sndrome nefrtico, asplenia o pacientes inmunocomprometidos, la revacunacin por nica vez 5 aos despus de la primera dosis de PPSV23.  Las personas que recibieron 1 o 2 dosis de   PPSV23 antes de los 65 aos de edad deben recibir otra dosis a los 65 aos o ms tarde si han pasado al menos 5 aos desde la ltima dosis.  Las dosis de PPSV23 no son necesarias para las personas vacunadas despus de la edad de 74 aos.  Vacuna antimeningoccica.  Los adultos con asplenia o deficiencias persistentes de componentes complementarios deben recibir 2 dosis de la vacuna cuadrivalente meningoccica conjugada (MenACWY-D). Estas dosis deben administrarse al menos con 2 meses de diferencia.  Los microbilogos que trabajan con ciertas bacterias meningoccicas, los militares reclutados, las personas en riesgo durante un brote y los que viajen o vivan en pases con alta tasa de meningitis, deben vacunarse.  Los estudiantes universitarios del Tourist information centre manager la edad de 21  aos que viven en residencias deben recibir 1 dosis si no la recibieron durante o despus de su cumpleaos nmero 16.  Los adultos que sufren ciertas enfermedades de alto riesgo deben recibir una o ms dosis de la vacuna.  Vacuna contra la hepatitis A.  Los adultos que deseen estar protegidos contra esta enfermedad, que sufren ciertas enfermedades de alto riesgo, los que trabajan con animales infectados con hepatitis A, los que trabajan en los laboratorios de investigacin de hepatitis A o que viajan o trabajan en pases con una alta tasa de hepatitis A deben vacunarse.  Los adultos que no fueron vacunados previamente y que Lucianne Lei a tener un contacto cercano con una persona adoptada fuera del pas, deben recibir la vacuna durante los primeros [address] despus de su llegada a los [place] desde un pas con una alta tasa de hepatitis A.  Vacuna contra la hepatitis B.  Los adultos que deseen estar protegidos contra esta enfermedad, que sufren ciertas enfermedades de alto riesgo, que puedan estar expuestos a sangre u otros fluidos corporales infecciosos, que tienen contactos familiares o parejas sexuales con hepatitis B positivo, que sean clientes o trabajadores de ciertos centros de atencin, o que viajan o trabajan en pases con una alta tasa de hepatitis B deben vacunarse.  Haemophilus influenzae tipo b (Hib).  Una persona que no ha sido vacunada que sufre asplenia o anemia de clulas falciformes o tiene una esplenectoma programada, debe recibir 1 dosis de la vacuna Hib.  Independientemente de la vacunacin anterior, un receptor de un trasplante de clulas madre hematopoyticas debe recibir Ardelia Mems serie de 3 dosis, 6 a12 meses despus de su trasplante exitoso.  La vacuna Hib no se recomienda para los adultos con infeccin por VIH.   Esta informacin no tiene Marine scientist el consejo del mdico. Asegrese de hacerle al mdico cualquier pregunta que tenga.   Document Released: 09/04/2005  Document Revised: 12/30/2012 Elsevier Interactive Patient Education [employer].

## 2015-08-03 NOTE — Progress Notes (Signed)
Patient ID: Jasmine Campbell, female   DOB: August 20, 1966, 49 y.o.   MRN: [id number]   08/03/2015 at 1:38 PM  [facility] / DOB: August 20, 1966 / MRN: [id number]  Problem list reviewed and updated by me where necessary.   SUBJECTIVE  Jasmine Campbell is a 49 y.o. well appearing female presenting for the chief complaint of needing flu shot and annual exam. She feels well. All labs done March 2016. Has mammagram scheduled next month..   She had normal complete gyn exam with Dr. Lily Campbell recently.  She  has a past medical history of Seasonal allergies.    Medications reviewed and updated by myself where necessary, and exist elsewhere in the encounter.   Ms. Jasmine Campbell has No Known Allergies. She  reports that she has never smoked. She has never used smokeless tobacco. She reports that she drinks alcohol. She reports that she does not use illicit drugs. She  reports that she currently engages in sexual activity and has had female partners. She reports using the following method of birth control/protection: IUD. The patient  has past surgical history that includes Cesarean section; Pelvic laparoscopy; and Intrauterine device insertion (2002).  Her family history includes Diabetes in her mother; Hypertension in her mother.  Review of Systems  Constitutional: Negative.   HENT: Negative.   Eyes: Negative.   Respiratory: Negative.   Cardiovascular: Negative.   Gastrointestinal: Negative.   Genitourinary: Negative.   Musculoskeletal: Negative.   Skin: Negative.   Neurological: Negative.   Endo/Heme/Allergies: Negative.   Psychiatric/Behavioral: Negative.     OBJECTIVE  Her  height is 5\' 2"  (1.575 m) and weight is 216 lb 9.6 oz (98.249 kg). Her oral temperature is 98.1 F (36.7 C). Her blood pressure is 118/80 and her pulse is 80. Her respiration is 16 and oxygen saturation is 97%.  The patient's body mass index is 39.61 kg/(m^2).  Physical Exam  Vitals  reviewed. Constitutional: She is oriented to person, place, and time. She appears well-developed and well-nourished. No distress.  HENT:  Head: Normocephalic.  Right Ear: External ear normal.  Left Ear: External ear normal.  Nose: Nose normal.  Mouth/Throat: Oropharynx is clear and moist.  Eyes: Conjunctivae and EOM are normal. Pupils are equal, round, and reactive to light. No scleral icterus.  Neck: Normal range of motion. Neck supple. No thyromegaly present.  Cardiovascular: Normal rate, regular rhythm, normal heart sounds and intact distal pulses.   Respiratory: Effort normal and breath sounds normal.  GI: Soft.  Musculoskeletal: Normal range of motion.  Lymphadenopathy:    She has no cervical adenopathy.  Neurological: She is alert and oriented to person, place, and time. She has normal reflexes. No cranial nerve deficit. She exhibits normal muscle tone. Coordination normal.  Skin: No rash noted.  Psychiatric: She has a normal mood and affect. Her behavior is normal. Judgment and thought content normal.   EKG normal  No results found for this or any previous visit (from the past 24 hour(s)).  ASSESSMENT & PLAN  Jasmine PandySoledad was seen today for annual exam.  Diagnoses and all orders for this visit:  Annual physical exam -     Flu Vaccine QUAD 36+ mos IM -     EKG 12-Lead  Needs flu shot -     Flu Vaccine QUAD 36+ mos IM -     EKG 12-Lead

## 2015-12-14 ENCOUNTER — Encounter: Payer: 59 | Admitting: Gynecology

## 2016-01-24 ENCOUNTER — Ambulatory Visit (INDEPENDENT_AMBULATORY_CARE_PROVIDER_SITE_OTHER): Payer: BLUE CROSS/BLUE SHIELD | Admitting: Gynecology

## 2016-01-24 ENCOUNTER — Encounter: Payer: Self-pay | Admitting: Gynecology

## 2016-01-24 VITALS — BP 132/84 | Ht 63.0 in | Wt 218.0 lb

## 2016-01-24 DIAGNOSIS — Z01419 Encounter for gynecological examination (general) (routine) without abnormal findings: Secondary | ICD-10-CM

## 2016-01-24 NOTE — Progress Notes (Signed)
Jasmine Campbell [birth year] [id number]   History:    50 y.o.  for annual gyn exam with no major complaints today. Very sporadically if her bladder is full and she sneezes she'll leak a little bit of urine but she does not have to wear a pad drawn the day. She delivered one child via C-section after long labor and ended up having a child that weighed tenderness have pounds. Patient had a Mirena IUD placed in [place] last year she's having very minimal of any menstrual cycles.  Patient stated she had a normal colonoscopy in 2015. 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  Gynecologic History Patient's last menstrual period was 12/15/2015. Contraception: IUD Last Pap: 2015. Results were: normal Last mammogram: 2016. Results were: normal  Obstetric History OB History  Gravida Para Term Preterm AB SAB TAB Ectopic Multiple Living  # Outcome Date GA Lbr Len/2nd Weight Sex Delivery Anes PTL Lv  1 Term     M CS-Unspec   Y       ROS: A ROS was performed and pertinent positives and negatives are included in the history.  GENERAL: No fevers or chills. HEENT: No change in vision, no earache, sore throat or sinus congestion. NECK: No pain or stiffness. CARDIOVASCULAR: No chest pain or pressure. No palpitations. PULMONARY: No shortness of breath, cough or wheeze. GASTROINTESTINAL: No abdominal pain, nausea, vomiting or diarrhea, melena or bright red blood per rectum. GENITOURINARY: No urinary frequency, urgency, hesitancy or dysuria. MUSCULOSKELETAL: No joint or muscle pain, no back pain, no recent trauma. DERMATOLOGIC: No rash, no itching, no lesions. ENDOCRINE:  No polyuria, polydipsia, no heat or cold intolerance. No recent change in weight. HEMATOLOGICAL: No anemia or easy bruising or bleeding. NEUROLOGIC: No headache, seizures, numbness, tingling or weakness. PSYCHIATRIC: No depression, no loss of interest in normal activity or change in sleep pattern.     Exam: chaperone present  BP 132/84 mmHg  Ht  (1.6 m)  Wt 218 lb (98.884 kg)  BMI 38.63 kg/m2  LMP 12/15/2015  Body mass index is 38.63 kg/(m^2).  General appearance : Well developed well nourished female. No acute distress HEENT: Eyes: no retinal hemorrhage or exudates,  Neck supple, trachea midline, no carotid bruits, no thyroidmegaly Lungs: Clear to auscultation, no rhonchi or wheezes, or rib retractions  Heart: Regular rate and rhythm, no murmurs or gallops Breast:Examined in sitting and supine position were symmetrical in appearance, no palpable masses or tenderness,  no skin retraction, no nipple inversion, no nipple discharge, no skin discoloration, no axillary or supraclavicular lymphadenopathy Abdomen: no palpable masses or tenderness, no rebound or guarding Extremities: no edema or skin discoloration or tenderness  Pelvic:  Bartholin, Urethra, Skene Glands: Within normal limits             Vagina: No gross lesions or discharge  Cervix: No gross lesions or discharge, IUD string visualized  Uterus  anteverted, normal size, shape and consistency, non-tender and mobile  Adnexa  Without masses or tenderness  Anus and perineum  normal   Rectovaginal  normal sphincter tone without palpated masses or tenderness  Hemoccult cards will be provided when she returns for her blood work     Assessment/Plan:  50 y.o. female for annual exam will return back to the office later next week for her fasting blood work consisting of the following: CBC, comprehensive metabolic panel, fasting lipid profile, TSH, and urinalysis. Patient was reminded do her monthly breast exams. She will  need her mammogram at the end of the year. Pap smear not indicated this year.   Ok EdwardsFERNANDEZ,Zriyah Kopplin H MD, 4:03 PM 01/24/2016

## 2016-01-25 ENCOUNTER — Other Ambulatory Visit: Payer: BLUE CROSS/BLUE SHIELD

## 2016-01-25 LAB — CBC WITH DIFFERENTIAL/PLATELET
BASOS ABS: 0 {cells}/uL (ref 0–200)
BASOS PCT: 0 %
EOS ABS: 235 {cells}/uL (ref 15–500)
Eosinophils Relative: 5 %
HCT: 40.5 % (ref 35.0–45.0)
Hemoglobin: 13.4 g/dL (ref 11.7–15.5)
LYMPHS PCT: 34 %
Lymphs Abs: 1598 cells/uL (ref 850–3900)
MCH: 28.3 pg (ref 27.0–33.0)
MCHC: 33.1 g/dL (ref 32.0–36.0)
MCV: 85.4 fL (ref 80.0–100.0)
MONO ABS: 423 {cells}/uL (ref 200–950)
MPV: 9.8 fL (ref 7.5–12.5)
Monocytes Relative: 9 %
Neutro Abs: 2444 cells/uL (ref 1500–7800)
Neutrophils Relative %: 52 %
Platelets: 243 10*3/uL (ref 140–400)
RBC: 4.74 MIL/uL (ref 3.80–5.10)
RDW: 13.5 % (ref 11.0–15.0)
WBC: 4.7 10*3/uL (ref 3.8–10.8)

## 2016-01-25 LAB — COMPREHENSIVE METABOLIC PANEL
ALT: 93 U/L — ABNORMAL HIGH (ref 6–29)
AST: 41 U/L — AB (ref 10–35)
Albumin: 4 g/dL (ref 3.6–5.1)
Alkaline Phosphatase: 114 U/L (ref 33–130)
BUN: 15 mg/dL (ref 7–25)
CALCIUM: 9.2 mg/dL (ref 8.6–10.4)
CHLORIDE: 106 mmol/L (ref 98–110)
CO2: 24 mmol/L (ref 20–31)
Creat: 1.02 mg/dL (ref 0.50–1.05)
Glucose, Bld: 123 mg/dL — ABNORMAL HIGH (ref 65–99)
Potassium: 4.4 mmol/L (ref 3.5–5.3)
SODIUM: 140 mmol/L (ref 135–146)
Total Bilirubin: 0.5 mg/dL (ref 0.2–1.2)
Total Protein: 6.8 g/dL (ref 6.1–8.1)

## 2016-01-25 LAB — LIPID PANEL
CHOL/HDL RATIO: 4 ratio (ref <=5.0)
CHOLESTEROL: 211 mg/dL — AB (ref 125–200)
HDL: 53 mg/dL (ref >=46)
LDL CALC: 123 mg/dL (ref <130)
TRIGLYCERIDES: 176 mg/dL — AB (ref <150)
VLDL: 35 mg/dL — AB (ref <30)

## 2016-01-25 LAB — URINALYSIS W MICROSCOPIC + REFLEX CULTURE
BILIRUBIN URINE: NEGATIVE
Bacteria, UA: NONE SEEN [HPF]
Casts: NONE SEEN [LPF]
Crystals: NONE SEEN [HPF]
GLUCOSE, UA: NEGATIVE
Hgb urine dipstick: NEGATIVE
KETONES UR: NEGATIVE
Leukocytes, UA: NEGATIVE
Nitrite: NEGATIVE
PROTEIN: NEGATIVE
RBC / HPF: NONE SEEN RBC/HPF (ref <=2)
SQUAMOUS EPITHELIAL / LPF: NONE SEEN [HPF] (ref <=5)
Specific Gravity, Urine: 1.008 (ref 1.001–1.035)
WBC, UA: NONE SEEN WBC/HPF (ref <=5)
Yeast: NONE SEEN [HPF]
pH: 5.5 (ref 5.0–8.0)

## 2016-01-25 LAB — TSH: TSH: 4.87 mIU/L — ABNORMAL HIGH

## 2016-02-01 ENCOUNTER — Other Ambulatory Visit: Payer: Self-pay | Admitting: Gynecology

## 2016-02-01 ENCOUNTER — Telehealth: Payer: Self-pay | Admitting: Family Medicine

## 2016-02-01 DIAGNOSIS — R7309 Other abnormal glucose: Secondary | ICD-10-CM

## 2016-02-01 DIAGNOSIS — R7989 Other specified abnormal findings of blood chemistry: Secondary | ICD-10-CM

## 2016-02-01 NOTE — Telephone Encounter (Signed)
Please arrange an appointment at the patient's convenience

## 2016-02-01 NOTE — Telephone Encounter (Signed)
Relation to pt: self  Call back number:[phone]   Reason for call:  Dr. Lily PeerFernandez recommend patient to establish with Dr. Drue NovelPaz patient was having some sugar concerns. Please advise

## 2016-02-02 ENCOUNTER — Other Ambulatory Visit: Payer: BLUE CROSS/BLUE SHIELD

## 2016-02-02 DIAGNOSIS — R7989 Other specified abnormal findings of blood chemistry: Secondary | ICD-10-CM

## 2016-02-02 DIAGNOSIS — R7309 Other abnormal glucose: Secondary | ICD-10-CM

## 2016-02-02 LAB — THYROID PANEL WITH TSH
Free Thyroxine Index: 2.3 (ref 1.4–3.8)
T3 Uptake: 28 % (ref 22–35)
T4, Total: 8.2 ug/dL (ref 4.5–12.0)
TSH: 2.9 mIU/L

## 2016-02-02 NOTE — Telephone Encounter (Signed)
Patient scheduled for 02/17/2016, patient was greatly appreciated.

## 2016-02-03 LAB — HEMOGLOBIN A1C
Hgb A1c MFr Bld: 6.2 % — ABNORMAL HIGH (ref <5.7)
Mean Plasma Glucose: 131 mg/dL

## 2016-02-17 ENCOUNTER — Ambulatory Visit (INDEPENDENT_AMBULATORY_CARE_PROVIDER_SITE_OTHER): Payer: BLUE CROSS/BLUE SHIELD | Admitting: Internal Medicine

## 2016-02-17 ENCOUNTER — Encounter: Payer: Self-pay | Admitting: Internal Medicine

## 2016-02-17 VITALS — BP 126/80 | HR 73 | Temp 97.7°F | Ht 63.0 in | Wt 217.2 lb

## 2016-02-17 DIAGNOSIS — R7303 Prediabetes: Secondary | ICD-10-CM

## 2016-02-17 DIAGNOSIS — R945 Abnormal results of liver function studies: Secondary | ICD-10-CM

## 2016-02-17 DIAGNOSIS — R7989 Other specified abnormal findings of blood chemistry: Secondary | ICD-10-CM

## 2016-02-17 NOTE — Patient Instructions (Signed)
GO TO THE FRONT DESK Schedule your next appointment for a  Follow up in 3 months    If you need more information about a healthy diet,  Diabetes visit: The American Heart Association, http://www.heart.org  The American diabetes Association  Http://www.diabetes.org

## 2016-02-17 NOTE — Progress Notes (Signed)
Subjective:    Patient ID: Jasmine Campbell, female    DOB: 05/27/1966, 50 y.o.   MRN: [id number]  DOS:  02/17/2016 Type of visit - description :  New pt Interval history: Referred by Dr. Lily PeerFernandez, A1c elevated. Patient has a long history of obesity, about 5 years ago she weighed 230 pounds, was able to drop down to 160 but has gradually regained all her  Wt Readings from Last 3 Encounters:  02/17/16 217 lb 4 oz (98.544 kg)  01/24/16 218 lb (98.884 kg)  08/03/15 216 lb 9.6 oz (98.249 kg)    Review of Systems Denies chest pain or difficulty breathing No nausea, vomiting, diarrhea  Past Medical History  Diagnosis Date  . Seasonal allergies   . Vertigo   . Elevated LFTs   . Chronic kidney disease   . History of chicken pox     Past Surgical History  Procedure Laterality Date  . Pelvic laparoscopy    . Intrauterine device insertion  2002    PARAGUARD T-380  . Ovarian cyst removal    . Cesarean section      x1    Social History   Social History  . Marital Status: Single    Spouse Name: N/A  . Number of Children: 1  . 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 Smoking status: Never Smoker   . Smokeless tobacco: Never Used  . Alcohol Use: 0.0 oz/week    0 Standard drinks or equivalent per week     Comment: occasional  . Drug Use: No  . Sexual Activity:    Partners: Male    Birth Control/ Protection: IUD     Comment: MIRENA   Other Topics Concern  . Not on file   Social History Narrative   Her adult son lives in [place]. She is from [place], came [place] in [place].   Lives w/ ex-husband and son        Medication List    Notice  As of 02/17/2016 11:59 PM   You have not been prescribed any medications.         Objective:   Physical Exam BP 126/80 mmHg  Pulse 73  Temp(Src) 97.7 F (36.5 C) (Oral)  Ht 5\' 3"  (1.6 m)  Wt 217 lb 4 oz (98.544 kg)  BMI 38.49 kg/m2   SpO2 98%  LMP 02/14/2016 (Exact Date) General:   Well developed, well nourished . NAD.  HEENT:  Normocephalic . Face symmetric, atraumatic Lungs:  CTA B Normal respiratory effort, no intercostal retractions, no accessory muscle use. Heart: RRR,  no murmur.  No pretibial edema bilaterally  Skin: Not pale. Not jaundice Neurologic:  alert & oriented X3.  Speech normal, gait appropriate for age and unassisted Psych--  Cognition and judgment appear intact.  Cooperative with normal attention span and concentration.  Behavior appropriate. No anxious or depressed appearing.      Assessment & Plan:   Assessment Hyperglycemia, A1c 6. 01-2016 Elevated LFTs:  --Nl ferritin 12-2014 Hep B, hep C negative 11-2014, US liver wnl 12-2014 --was dx fatty liver per pt Morbid obesity (BMI 38, hyperglicemia, fatty liver) Sickle cell trait (see labs at care everywhere) H/o Vertigo x 2 2016 IUD in place   Plan: Hyperglycemia, prediabetes: A1c results discussed, extensive discussion about her recent labs, diet, exercise. Will call if interested in nutritionist referral, resources provided. Elevated LFTs:  Chart reviewed, see  above  RTC 3 months

## 2016-02-17 NOTE — Progress Notes (Signed)
Pre visit review using our clinic review tool, if applicable. No additional management support is needed unless otherwise documented below in the visit note. 

## 2016-02-18 DIAGNOSIS — Z09 Encounter for follow-up examination after completed treatment for conditions other than malignant neoplasm: Secondary | ICD-10-CM | POA: Insufficient documentation

## 2016-02-18 NOTE — Assessment & Plan Note (Signed)
Hyperglycemia, prediabetes: A1c results discussed, extensive discussion about her recent labs, diet, exercise. Will call if interested in nutritionist referral, resources provided. Elevated LFTs:  Chart reviewed, see above  RTC 3 months

## 2016-05-23 ENCOUNTER — Encounter: Payer: Self-pay | Admitting: Internal Medicine

## 2016-05-23 ENCOUNTER — Ambulatory Visit (INDEPENDENT_AMBULATORY_CARE_PROVIDER_SITE_OTHER): Payer: BLUE CROSS/BLUE SHIELD | Admitting: Internal Medicine

## 2016-05-23 VITALS — BP 112/70 | HR 72 | Temp 98.0°F | Resp 12 | Ht 63.0 in | Wt 216.5 lb

## 2016-05-23 DIAGNOSIS — R7989 Other specified abnormal findings of blood chemistry: Secondary | ICD-10-CM | POA: Diagnosis not present

## 2016-05-23 DIAGNOSIS — Z23 Encounter for immunization: Secondary | ICD-10-CM

## 2016-05-23 DIAGNOSIS — R739 Hyperglycemia, unspecified: Secondary | ICD-10-CM

## 2016-05-23 DIAGNOSIS — R5383 Other fatigue: Secondary | ICD-10-CM | POA: Diagnosis not present

## 2016-05-23 DIAGNOSIS — R945 Abnormal results of liver function studies: Secondary | ICD-10-CM

## 2016-05-23 MED ORDER — LIRAGLUTIDE -WEIGHT MANAGEMENT 18 MG/3ML ~~LOC~~ SOPN: 0.6000 mg | PEN_INJECTOR | Freq: Every day | SUBCUTANEOUS | 0 refills | Status: DC

## 2016-05-23 NOTE — Progress Notes (Signed)
Subjective:    Patient ID: Jasmine BeardSoledad Campbell, female    DOB: Nov 08, 1965, 50 y.o.   MRN: [id number]  DOS:  05/23/2016 Type of visit - description : f/u, Here with her husband Interval history: Since the last office visit, did not see a nutritionist but  did  minor changes on her diet mostly reducing  the amount of carbohydrates she eats. Has not been able to exercise more. Did complain of fatigue, a new issue, for the last few weeks. Allergies? Occasional generalized itching without rash. Better with OTC antihistaminics.    Wt Readings from Last 3 Encounters:  05/23/16 216 lb 8 oz (98.2 kg)  02/17/16 217 lb 4 oz (98.5 kg)  01/24/16 218 lb (98.9 kg)     Review of Systems  Denies chest pain or difficulty breathing. No lower extremity edema No cough No snoring. No anxiety or depression  Past Medical History:  Diagnosis Date  . Chronic kidney disease   . Elevated LFTs   . History of chicken pox   . Seasonal allergies   . Vertigo     Past Surgical History:  Procedure Laterality Date  . CESAREAN SECTION     x1  . INTRAUTERINE DEVICE INSERTION  2002   PARAGUARD T-380  . OVARIAN CYST REMOVAL    . PELVIC LAPAROSCOPY      Social History   Social History  . Marital status: Single    Spouse name: N/A  . Number of children: 1  . 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 Smoking status: Never Smoker  . Smokeless tobacco: Never Used  . Alcohol use 0.0 oz/week     Comment: occasional  . Drug use: No  . Sexual activity: Yes    Partners: Male    Birth control/ protection: IUD     Comment: MIRENA   Other Topics Concern  . Not on file   Social History Narrative   Her adult son lives in [place]. She is from [place], [place].   Lives w/ ex-husband and son        Medication List       Accurate as of 05/23/16  6:33 PM. Always use your most recent med list.          Liraglutide -Weight Management 18 MG/3ML Sopn Commonly known as:  SAXENDA Inject 0.6 mg into the skin daily. 0.6 mg SQ qd x 1 week, then 1.2  mg qd SQ          Objective:   Physical Exam BP 112/70 (BP Location: Right Arm, Patient Position: Sitting, Cuff Size: Normal)   Pulse 72   Temp 98 F (36.7 C) (Oral)   Resp 12   Ht 5\' 3"  (1.6 m)   Wt 216 lb 8 oz (98.2 kg)   SpO2 97%   BMI 38.35 kg/m  General:   Well developed, well nourished . NAD.  HEENT:  Normocephalic . Face symmetric, atraumatic Skin: Not pale. Not jaundice Neurologic:  alert & oriented X3.  Speech normal, gait appropriate for age and unassisted Psych--  Cognition and judgment appear intact.  Cooperative with normal attention span and concentration.  Behavior appropriate. No anxious or depressed appearing.      Assessment & Plan:   Assessment  (new pt 02-2016) Hyperglycemia, A1c 6. 01-2016 Elevated LFTs:  --Nl ferritin 12-2014 Hep B, hep C negative 11-2014, US liver wnl 12-2014 --  was dx fatty liver per pt Morbid obesity (BMI 38, hyperglicemia, fatty liver) Sickle cell trait (see labs at care everywhere) H/o Vertigo x 2 2016 IUD in place   PLAN: Hyperglycemia: Extensive discussion about diet, calorie counting; check A1c Elevated LFTs: Patient request labs. Morbid obesity: Again we had extensive discussion about diet, exercise and medications. Saxenda? Rx printed to see if covered by her insurance but recommend not to start it unless she is trained by one of my nurses. allergies: Also extensive discussion, patient to identify allergens and take a OTC antihistaminics. If not better will call for a referral Fatigue: Going on for a few weeks, she has a very difficult work schedule, not snoring but nevertheless could be related to sleep apnea. Will check vitamin levels.  Primary care: Flu shot RTC  3-4 months   Today, I spent more than 35 minutes with the patient: >50% of the time counseling regards Diet,  exercise, pros-cons of weight loss medications, explaining saxenda may not be covered by her insurance, multiple questions answered to the best of my ability

## 2016-05-23 NOTE — Progress Notes (Signed)
Pre visit review using our clinic review tool, if applicable. No additional management support is needed unless otherwise documented below in the visit note. 

## 2016-05-23 NOTE — Patient Instructions (Addendum)
GO TO THE LAB : Get the blood work     GO TO THE FRONT DESK Schedule your next appointment for a  Check up, in 3-4 months   App para contar calorias:  MYFITNESSPAL   Medicina para bajar de peso :  SAXENDA , don't use until you see my nurse for training

## 2016-05-23 NOTE — Assessment & Plan Note (Signed)
Hyperglycemia: Extensive discussion about diet, calorie counting; check A1c Elevated LFTs: Patient request labs. Morbid obesity: Again we had extensive discussion about diet, exercise and medications. Saxenda? Rx printed to see if covered by her insurance but recommend not to start it unless she is trained by one of my nurses. allergies: Also extensive discussion, patient to identify allergens and take a OTC antihistaminics. If not better will call for a referral Fatigue: Going on for a few weeks, she has a very difficult work schedule, not snoring but nevertheless could be related to sleep apnea. Will check vitamin levels.  Primary care: Flu shot RTC  3-4 months

## 2016-05-24 LAB — VITAMIN B12: Vitamin B-12: 285 pg/mL (ref 211–911)

## 2016-05-24 LAB — HEMOGLOBIN A1C: Hgb A1c MFr Bld: 5.8 % (ref 4.6–6.5)

## 2016-05-24 LAB — FOLATE: Folate: 13.5 ng/mL (ref >5.9)

## 2016-05-24 LAB — ALT: ALT: 44 U/L — AB (ref 0–35)

## 2016-05-24 LAB — AST: AST: 24 U/L (ref 0–37)

## 2016-05-25 ENCOUNTER — Telehealth: Payer: Self-pay

## 2016-05-25 LAB — VITAMIN D 1,25 DIHYDROXY
Vitamin D 1, 25 (OH)2 Total: 48 pg/mL (ref 18–72)
Vitamin D3 1, 25 (OH)2: 48 pg/mL

## 2016-05-25 NOTE — Telephone Encounter (Signed)
PA initiated via Covermymeds; KEY: [id number]: [id number]. Awaiting determination.

## 2016-05-29 NOTE — Telephone Encounter (Signed)
Advise patient of her insurance's  decision. If she is willing to try send a prescription for Belviq 10 mg one tablet twice a day #60, 1 refill

## 2016-05-29 NOTE — Telephone Encounter (Signed)
Received denial for Saxenda. Jasmine CoveySaxenda is covered when member has tried both Belviq and Qsymia and they did not work or were not tolerated. Alternatives are Belviq and Qsymia with approved PA.

## 2016-05-30 NOTE — Telephone Encounter (Signed)
MyChart message sent to Pt. Awaiting response. Med list updated.

## 2016-08-30 ENCOUNTER — Other Ambulatory Visit: Payer: Self-pay | Admitting: Gynecology

## 2016-08-30 DIAGNOSIS — Z1231 Encounter for screening mammogram for malignant neoplasm of breast: Secondary | ICD-10-CM

## 2016-09-05 ENCOUNTER — Ambulatory Visit (INDEPENDENT_AMBULATORY_CARE_PROVIDER_SITE_OTHER): Payer: BLUE CROSS/BLUE SHIELD | Admitting: Internal Medicine

## 2016-09-05 ENCOUNTER — Encounter: Payer: Self-pay | Admitting: Internal Medicine

## 2016-09-05 DIAGNOSIS — R202 Paresthesia of skin: Secondary | ICD-10-CM

## 2016-09-05 DIAGNOSIS — R739 Hyperglycemia, unspecified: Secondary | ICD-10-CM

## 2016-09-05 DIAGNOSIS — M25562 Pain in left knee: Secondary | ICD-10-CM | POA: Diagnosis not present

## 2016-09-05 NOTE — Patient Instructions (Addendum)
GO TO THE FRONT DESK Schedule your next appointment for a  Physical exam in 6 months, fasting   COMPRE DOS " WRIST BRACES" , UNO PARA CADA MUN~ECA. USELOS TODAS LAS NOCHES. SI NO SE MEJORA, ME AVISA  LA VAMOS A MANDAR A SPORTS MEDICINE POR SU DOLOR DE RODILLA

## 2016-09-05 NOTE — Progress Notes (Signed)
Pre visit review using our clinic review tool, if applicable. No additional management support is needed unless otherwise documented below in the visit note. 

## 2016-09-05 NOTE — Assessment & Plan Note (Signed)
Hyperglycemia: Last A1c satisfactory, recheck on RTC Morbid obesity: Unable to get approval for or buy out of pocket Saxenda, no significant changes on her lifestyle. Recommend a healthier diet and routine exercise. L  knee pain: Refer to sports medicine Hand paresthesias, bilaterally. Sx worse at night, neuromuscular exam normal, CTS? Recommend a trial with wrist splinters. Fatigue: Labs were normal. Reassess on RTC RTC 6 months, CPX.

## 2016-09-05 NOTE — Progress Notes (Signed)
Subjective:    Patient ID: Jasmine Campbell, female    DOB: June 24, 1966, 50 y.o.   MRN: [id number]  DOS:  09/05/2016 Type of visit - description : F/U Interval history: Obesity, hyperglycemia: Last A1c satisfactory, was not able to get saxenda, not following a healthier diet or exercising. Several weeks history of left knee pain, worse with walking, no injury. Several months history of bilateral hand numbness, both at the ulnar and radial sides, worse at night. Denies neck or elbow pain. She does work using her hands a lot. Continue with some fatigue. Wonders if a bone density test is needed (she is not menopausal, not yet)  Wt Readings from Last 3 Encounters:  09/05/16 220 lb 6 oz (100 kg)  05/23/16 216 lb 8 oz (98.2 kg)  02/17/16 217 lb 4 oz (98.5 kg)     Review of Systems   Past Medical History:  Diagnosis Date  . Chronic kidney disease   . Elevated LFTs   . History of chicken pox   . Morbid obesity (HCC)   . Seasonal allergies   . Vertigo     Past Surgical History:  Procedure Laterality Date  . CESAREAN SECTION     x1  . INTRAUTERINE DEVICE INSERTION  2002   PARAGUARD T-380  . OVARIAN CYST REMOVAL    . PELVIC LAPAROSCOPY      Social History   Social History  . Marital status: Single    Spouse name: N/A  . Number of children: 1  . 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 Smoking status: Never Smoker  . Smokeless tobacco: Never Used  . Alcohol use 0.0 oz/week     Comment: occasional  . Drug use: No  . Sexual activity: Yes    Partners: Male    Birth control/ protection: IUD     Comment: MIRENA   Other Topics Concern  . Not on file   Social History Narrative   Her adult son lives in [place]. She is from [place], came [place].   Lives w/ ex-husband and son      Allergies as of 09/05/2016   No Known Allergies     Medication List    as of  09/05/2016  6:17 PM   You have not been prescribed any medications.        Objective:   Physical Exam BP 124/78 (BP Location: Left Arm, Patient Position: Sitting, Cuff Size: Normal)   Pulse 75   Temp 97.9 F (36.6 C) (Oral)   Resp 12   Ht 5\' 3"  (1.6 m)   Wt 220 lb 6 oz (100 kg)   LMP 08/13/2016 (Approximate)   SpO2 99%   BMI 39.04 kg/m  General:   Well developed, well nourished . NAD.  HEENT:  Normocephalic . Face symmetric, atraumatic Neck-full range of motion, no TTP at the cervical spine MSK:  Hands ,wrists and elbows normal to inspection, palpation. Left knee: No swelling, erythema or warmness Skin: Not pale. Not jaundice Neurologic:  alert & oriented X3.  Speech normal, gait appropriate for age and unassisted. DTRs and strength symmetric Psych--  Cognition and judgment appear intact.  Cooperative with normal attention span and concentration.  Behavior appropriate. No anxious or depressed appearing.      Assessment & Plan:   Assessment  (new pt 02-2016) Hyperglycemia, A1c 6. 01-2016 Elevated LFTs:  --Nl ferritin 12-2014 Hep  B, hep C negative 11-2014, US liver wnl 12-2014 --was dx fatty liver per pt Morbid obesity (BMI 38, hyperglicemia, fatty liver) Sickle cell trait (see labs at care everywhere) H/o Vertigo x 2 2016 IUD in place   PLAN: Hyperglycemia: Last A1c satisfactory, recheck on RTC Morbid obesity: Unable to get approval for or buy out of pocket Saxenda, no significant changes on her lifestyle. Recommend a healthier diet and routine exercise. L  knee pain: Refer to sports medicine Hand paresthesias, bilaterally. Sx worse at night, neuromuscular exam normal, CTS? Recommend a trial with wrist splinters. Fatigue: Labs were normal. Reassess on RTC RTC 6 months, CPX.

## 2016-09-08 ENCOUNTER — Ambulatory Visit: Payer: BLUE CROSS/BLUE SHIELD

## 2016-10-03 ENCOUNTER — Ambulatory Visit: Payer: BLUE CROSS/BLUE SHIELD

## 2016-10-11 ENCOUNTER — Ambulatory Visit
Admission: RE | Admit: 2016-10-11 | Discharge: 2016-10-11 | Disposition: A | Discharge status: Home / Self Care | Payer: BLUE CROSS/BLUE SHIELD | Source: Ambulatory Visit | Attending: Gynecology | Admitting: Gynecology

## 2016-10-11 DIAGNOSIS — Z1231 Encounter for screening mammogram for malignant neoplasm of breast: Secondary | ICD-10-CM

## 2017-01-25 ENCOUNTER — Encounter (INDEPENDENT_AMBULATORY_CARE_PROVIDER_SITE_OTHER): Payer: Self-pay | Admitting: Gynecology

## 2017-01-31 ENCOUNTER — Encounter: Payer: Self-pay | Admitting: Gynecology

## 2017-02-02 ENCOUNTER — Encounter: Payer: Self-pay | Admitting: Gynecology

## 2017-02-02 ENCOUNTER — Ambulatory Visit (INDEPENDENT_AMBULATORY_CARE_PROVIDER_SITE_OTHER): Payer: BLUE CROSS/BLUE SHIELD | Admitting: Gynecology

## 2017-02-02 VITALS — BP 110/78 | Ht 63.0 in | Wt 216.0 lb

## 2017-02-02 DIAGNOSIS — N951 Menopausal and female climacteric states: Secondary | ICD-10-CM

## 2017-02-02 DIAGNOSIS — R7989 Other specified abnormal findings of blood chemistry: Secondary | ICD-10-CM

## 2017-02-02 DIAGNOSIS — R945 Abnormal results of liver function studies: Secondary | ICD-10-CM

## 2017-02-02 DIAGNOSIS — Z01419 Encounter for gynecological examination (general) (routine) without abnormal findings: Secondary | ICD-10-CM

## 2017-02-02 LAB — AST: AST: 23 U/L (ref 10–35)

## 2017-02-02 LAB — ALT: ALT: 42 U/L — ABNORMAL HIGH (ref 6–29)

## 2017-02-02 NOTE — Progress Notes (Signed)
Appointment canceled.

## 2017-02-02 NOTE — Progress Notes (Signed)
Jasmine Campbell March 23, 1966 [id number]   History:    51 y.o.  for annual gyn exam with a complaint of hot flashes and skipping periods. She stated she had no menstrual cycle in the month of January February and March and had a 5 day cycle in April and May. 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 Patient with no prior history of any abnormal Pap smears reported.   Past medical history,surgical history, family history and social history were all reviewed and documented in the EPIC chart.  Gynecologic History Patient's last menstrual period was 01/29/2017. Contraception: condoms Last Pap: 2012 2015. Results were: normal Last mammogram: 2018. Results were: normal  Obstetric History OB History  Gravida Para Term Preterm AB Living  1 1 1     1   SAB TAB Ectopic Multiple Live Births          1    # Outcome Date GA Lbr Len/2nd Weight Sex Delivery Anes PTL Lv  1 Term     M CS-Unspec   LIV       ROS: A ROS was performed and pertinent positives and negatives are included in the history.  GENERAL: No fevers or chills. HEENT: No change in vision, no earache, sore throat or sinus congestion. NECK: No pain or stiffness. CARDIOVASCULAR: No chest pain or pressure. No palpitations. PULMONARY: No shortness of breath, cough or wheeze. GASTROINTESTINAL: No abdominal pain, nausea, vomiting or diarrhea, melena or bright red blood per rectum. GENITOURINARY: No urinary frequency, urgency, hesitancy or dysuria. MUSCULOSKELETAL: No joint or muscle pain, no back pain, no recent trauma. DERMATOLOGIC: No rash, no itching, no lesions. ENDOCRINE:  No polyuria, polydipsia, no heat or cold intolerance. No recent change in weight. HEMATOLOGICAL: No anemia or easy bruising or bleeding. NEUROLOGIC: No headache, seizures, numbness, tingling or weakness. PSYCHIATRIC: No depression, no loss of interest in normal activity or change in sleep pattern.     Exam: chaperone present  BP 110/78   Ht 5\' 3"  (1.6 m)   Wt 216 lb (98 kg)   LMP 01/29/2017   BMI 38.26 kg/m   Body mass index is 38.26 kg/m.  General appearance : Well developed well nourished female. No acute distress HEENT: Eyes: no retinal hemorrhage or exudates,  Neck supple, trachea midline, no carotid bruits, no thyroidmegaly Lungs: Clear to auscultation, no rhonchi or wheezes, or rib retractions  Heart: Regular rate and rhythm, no murmurs or gallops Breast:Examined in sitting and supine position were symmetrical in appearance, no palpable masses or tenderness,  no skin retraction, no nipple inversion, no nipple discharge, no skin discoloration, no axillary or supraclavicular lymphadenopathy Abdomen: no palpable masses or tenderness, no rebound or guarding Extremities: no edema or skin discoloration or tenderness  Pelvic:  Bartholin, Urethra, Skene Glands: Within normal limits             Vagina: No gross lesions or discharge  Cervix: No gross lesions or discharge  Uterus  anteverted, normal size, shape and consistency, non-tender and mobile  Adnexa  Without masses or tenderness  Anus and perineum  normal   Rectovaginal  normal sphincter tone without palpated masses or tenderness  Hemoccult cards provided     Assessment/Plan:  51 y.o. female for annual exam who may be perimenopausal/menopausal [facility]. We are going to go ahead and check her SGOT and SGPT to compare with September 2015 and if normal and she needs to be started on hormone replacement therapy we'll start her on a low dose transdermal patch and continue to protect endometrium with her  Mirena IUD. The Mirena IUD needs to be removed next year so which time Prometrium 20 mg by mouth daily for 12 days out of the month could be added to her regimen. Her Pap smear was done today. The rest her last being drawn by her PCP. All the above literature information was provided in Spanish and we'll see her next week in consultation.   Ok EdwardsFERNANDEZ,Jasmine Geno H MD, 3:29 PM 02/02/2017

## 2017-02-02 NOTE — Patient Instructions (Addendum)
Menopausia y terapia de reemplazo hormonal (Menopause and Hormone Replacement Therapy) QU ES LA TERAPIA DE REEMPLAZO HORMONAL? La terapia de reemplazo hormonal (TRH) es el uso de hormonas artificiales (sintticas) para Training and development officer las hormonas que el cuerpo deja de producir durante la menopausia. La menopausia es la etapa normal de la vida cuando los perodos Kellogg desaparecen y los ovarios dejan de producir hormonas femeninas, estrgenos y Immunologist. Esta falta de hormonas puede afectar la salud y causar sntomas indeseables. La TRH puede aliviar algunos de esos sntomas. CULES SON MIS OPCIONES PARA UNA TRH? La TRH puede consistir en hormonas sintticas de Oceanographer y Immunologist o puede consistir solamente en estrgeno (terapia de estrgenos solamente). Usted y su mdico decidirn qu tipo de TRH es la ms [place] para usted. Si elige Lucilla Edin TRH y tiene tero, generalmente se prescribe estrgeno y progesterona. La terapia de estrgenos solamente se [place] para las mujeres que no tienen tero. Las siguientes son algunas de las opciones para una TRH:  Pldoras.  Parches.  Geles.  Aerosoles.  Cremas vaginales.  Anillo vaginal.  Dispositivos vaginales. La cantidad de hormonas que tome y Physiological scientist en que deba tomarlas depender de su salud en particular. Es importante:  Comenzar una TRH con la dosificacin ms baja posible.  Interrumpa la TRH tan pronto como su mdico se lo indique.  Colabore con su mdico para estar bien informado y sentirse cmodo con sus decisiones. CULES SON LOS BENEFICIOS DE LA TRH? La TRH puede reducir la frecuencia y la gravedad de los sntomas de la menopausia. Los beneficios de la TRH pueden variar segn los sntomas de la menopausia que tenga, la gravedad de estos y su salud general. La TRH puede ayudarle a mejorar los siguientes sntomas de la menopausia:  Sofocones y sudores nocturnos. Estos consisten en una sensacin de calor que se  extiende por el rostro y el cuerpo. La piel se puede volver roja, como ruborizada. Los sudores nocturnos son oleadas de calor que ocurren al estar dormida o tratando de dormir.  Prdida de masa sea (osteoporosis). El organismo pierde calcio ms rpido despus de la menopausia, lo que debilita a los Jud. Esto puede aumentar el riesgo de huesos rotos (fracturas).  Sequedad vaginal. La membrana de la vagina se hace ms delgada y seca lo que puede causar dolor durante las relaciones sexuales o causar infecciones, ardor o picazn.  Infecciones en las vas urinarias.  Incontinencia urinaria. Esta es la disminucin de la capacidad de Chief Technology Officer la [place].  Irritabilidad.  Problemas de memoria de [employer]. CULES SON LOS RIESGOS DE LA TRH? Los riesgos de la TRH pueden variar segn su salud y su historia clnica. Los riesgos de la TRH tambin dependen de si usted recibe estrgeno y progesterona o solamente estrgeno.La TRH puede aumentar el riesgo de:  Tener prdidas. Estas ocurren cuando la vagina pierde repentinamente una pequea cantidad de [place].  Cncer de endometrio. Este cncer ocurre en la membrana del tero (endometrio).  Cncer de mama.  Aumenta la densidad del tejido de las [place]. Esto puede dificultar la deteccin de cncer de mamas con una radiografa de mamas (mamografa).  Ictus.  Infarto de miocardio.  Cogulos sanguneos.  Enfermedad de la vescula biliar. Los riesgos de la TRH pueden ser [employer] si tiene alguna de las siguientes afecciones:  Hotel manager de endometrio.  Enfermedad heptica.  Cardiopata.  Cncer de mama.  Antecedentes de cogulos sanguneos.  Antecedentes de ictus. CMO DEBO CUIDARME CUANDO ESTOY REALIZANDO UNA TRH?  [employer]  medicamentos de venta libre y los recetados solamente como se lo haya indicado el mdico.  Hgase mamografas, exmenes plvicos y chequeos con la frecuencia que le indique el mdico.  Hgase pruebas de Papanicolu  con la frecuencia que le indique el mdico. A esta prueba tambin se la denomina "Pap". Es una prueba de deteccin que se utiliza para Engineer, manufacturing signos de cncer en el cuello del tero y la vagina. La prueba de Papanicolu tambin puede identificar la presencia de infeccin o cambios precancerosos. Las pruebas de Papanicolaou se pueden Education officer, environmental:  Cada 3 aos, a Glass blower/designer de los [address].  Cada 5 aos, a partir de los [address] [place], en combinacin con las pruebas que se realizan para Landscape architect presencia del virus del Geneticist, molecular (VPH).  Con mayor o menor frecuencia segn las afecciones mdicas que tenga, su edad y otros factores de [place].  Es su responsabilidad retirar el resultado de la prueba de Papanicolu. Consulte a su mdico o en el departamento donde se realice el procedimiento cundo estarn [employer].  Concurra a todas las visitas de control como se lo haya indicado el mdico. Esto es importante. CUNDO DEBO BUSCAR ATENCIN MDICA? Hable con el mdico en los siguientes casos:  Tiene alguno de estos sntomas:  Dolor o hinchazn en las piernas.  Falta de aire.  Dolor en el pecho.  Bultos o cambios en las mamas o en las [place].  Hablar arrastrando las palabras.  Dolor, ardor o sangrado al [employer].  Tiene alguno de estos sntomas:  Hemorragia vaginal inusual.  Mareos o dolores de [place].  Adormecimiento o debilidad en cualquier parte de los brazos o de las piernas.  Dolor en el abdomen. Esta informacin no tiene Theme park manager el consejo del mdico. Asegrese de hacerle al mdico cualquier pregunta que tenga. Document Released: 02/21/2008 Document Revised: 12/27/2015 Document Reviewed: 03/08/2015 Elsevier Interactive Patient Education  2017 Elsevier Inc. Menopause Menopause is the normal time of life when menstrual periods stop completely. Menopause is complete when you have missed 12 consecutive menstrual periods. It usually occurs between the ages of 48  years and 55 years. Very rarely does a woman develop menopause before the age of 40 years. At menopause, your ovaries stop producing the female hormones estrogen and progesterone. This can cause undesirable symptoms and also affect your health. Sometimes the symptoms may occur 4-5 years before the menopause begins. There is no relationship between menopause and:  Oral contraceptives.  Number of children you had.  Race.  The age your menstrual periods started (menarche). Heavy smokers and very thin women may develop menopause earlier in life. What are the causes?  The ovaries stop producing the female hormones estrogen and progesterone. Other causes include:  Surgery to remove both ovaries.  The ovaries stop functioning for no known reason.  Tumors of the pituitary gland in the brain.  Medical disease that affects the ovaries and hormone production.  Radiation treatment to the abdomen or pelvis.  Chemotherapy that affects the ovaries. What are the signs or symptoms?  Hot flashes.  Night sweats.  Decrease in sex drive.  Vaginal dryness and thinning of the vagina causing painful intercourse.  Dryness of the skin and developing wrinkles.  Headaches.  Tiredness.  Irritability.  Memory problems.  Weight gain.  Bladder infections.  Hair growth of the face and chest.  Infertility. More serious symptoms include:  Loss of bone (osteoporosis) causing breaks (fractures).  Depression.  Hardening and narrowing of the arteries (atherosclerosis) causing  heart attacks and strokes. How is this diagnosed?  When the menstrual periods have stopped for 12 straight months.  Physical exam.  Hormone studies of the blood. How is this treated? There are many treatment choices and nearly as many questions about them. The decisions to treat or not to treat menopausal changes is an individual choice made with your health care provider. Your health care provider can discuss the  treatments with you. Together, you can decide which treatment will work best for you. Your treatment choices may include:  Hormone therapy (estrogen and progesterone).  Non-hormonal medicines.  Treating the individual symptoms with medicine (for example antidepressants for depression).  Herbal medicines that may help specific symptoms.  Counseling by a psychiatrist or psychologist.  Group therapy.  Lifestyle changes including:  Eating healthy.  Regular exercise.  Limiting caffeine and alcohol.  Stress management and meditation.  No treatment. Follow these instructions at home:  Take the medicine your health care provider gives you as directed.  Get plenty of sleep and rest.  Exercise regularly.  Eat a diet that contains calcium (good for the bones) and soy products (acts like estrogen hormone).  Avoid alcoholic beverages.  Do not smoke.  If you have hot flashes, dress in layers.  Take supplements, calcium, and vitamin D to strengthen bones.  You can use over-the-counter lubricants or moisturizers for vaginal dryness.  Group therapy is sometimes very helpful.  Acupuncture may be helpful in some cases. Contact a health care provider if:  You are not sure you are in menopause.  You are having menopausal symptoms and need advice and treatment.  You are still having menstrual periods after age 53 years.  You have pain with intercourse.  Menopause is complete (no menstrual period for 12 months) and you develop vaginal bleeding.  You need a referral to a specialist (gynecologist, psychiatrist, or psychologist) for treatment. Get help right away if:  You have severe depression.  You have excessive vaginal bleeding.  You fell and think you have a broken bone.  You have pain when you urinate.  You develop leg or chest pain.  You have a fast pounding heart beat (palpitations).  You have severe headaches.  You develop vision problems.  You feel a  lump in your breast.  You have abdominal pain or severe indigestion. This information is not intended to replace advice given to you by your health care provider. Make sure you discuss any questions you have with your health care provider. Document Released: 11/25/2003 Document Revised: 02/10/2016 Document Reviewed: 04/03/2013 Elsevier Interactive Patient Education  2017 [employer]

## 2017-02-02 NOTE — Addendum Note (Signed)
Addended by: Kem ParkinsonBARNES, Myalynn Lingle on: 02/02/2017 04:05 PM   Modules accepted: Orders

## 2017-02-05 LAB — PAP IG W/ RFLX HPV ASCU

## 2017-02-09 ENCOUNTER — Ambulatory Visit: Payer: BLUE CROSS/BLUE SHIELD | Admitting: Gynecology

## 2017-02-09 ENCOUNTER — Ambulatory Visit (INDEPENDENT_AMBULATORY_CARE_PROVIDER_SITE_OTHER): Payer: BLUE CROSS/BLUE SHIELD | Admitting: Gynecology

## 2017-02-09 ENCOUNTER — Other Ambulatory Visit: Payer: Self-pay | Admitting: Gynecology

## 2017-02-09 DIAGNOSIS — Z1159 Encounter for screening for other viral diseases: Secondary | ICD-10-CM

## 2017-02-09 DIAGNOSIS — N951 Menopausal and female climacteric states: Secondary | ICD-10-CM

## 2017-02-10 LAB — FOLLICLE STIMULATING HORMONE: FSH: 42.3 m[IU]/mL

## 2017-02-10 LAB — HEPATITIS C ANTIBODY: HCV Ab: NEGATIVE

## 2017-02-13 ENCOUNTER — Telehealth: Payer: Self-pay | Admitting: *Deleted

## 2017-02-13 ENCOUNTER — Encounter: Payer: Self-pay | Admitting: Gastroenterology

## 2017-02-13 DIAGNOSIS — R7989 Other specified abnormal findings of blood chemistry: Secondary | ICD-10-CM

## 2017-02-13 DIAGNOSIS — R945 Abnormal results of liver function studies: Principal | ICD-10-CM

## 2017-02-13 NOTE — Telephone Encounter (Signed)
Referral placed [place] will call to schedule.

## 2017-02-13 NOTE — Telephone Encounter (Signed)
Appointment on 03/13/17

## 2017-02-13 NOTE — Telephone Encounter (Signed)
Per JF message:  Please make an appointment for this patient with [facility] as a result of elevsted LFT's

## 2017-02-14 ENCOUNTER — Other Ambulatory Visit: Payer: Self-pay | Admitting: Gynecology

## 2017-02-14 ENCOUNTER — Encounter: Payer: Self-pay | Admitting: Gynecology

## 2017-02-14 DIAGNOSIS — K760 Fatty (change of) liver, not elsewhere classified: Secondary | ICD-10-CM

## 2017-02-14 NOTE — Progress Notes (Signed)
Patient was briefly seen in the hallway in the office today she [facility] and as a result of her elevated liver function tests were going to hold off on started on any hormonal replacement therapy if her FSH was elevated. She should return back to the office in 3 months or repeat her liver function test. We will also get a check a hepatitis panel as well.  It was noted that her AST last year was elevated at 41 then in September 2017 dropped down to 24 and then recently at 23 on the normal range. Her ALT may ninth 2017 was elevated at 93 05/23/2016 dropped down to 44 and May 18 was down to 42.  Her hepatitis panel that was requested for some reason only ran a hepatitis C which was normal so we'll check with the lab to run the rest of the panel.  Patient has a Mirena IUD that needs to be removed next year.  We will make arrangements for follow-up with her gastroenterologist.

## 2017-02-19 NOTE — Progress Notes (Signed)
Erroneous entry

## 2017-02-26 ENCOUNTER — Ambulatory Visit (INDEPENDENT_AMBULATORY_CARE_PROVIDER_SITE_OTHER): Payer: BLUE CROSS/BLUE SHIELD | Admitting: Internal Medicine

## 2017-02-26 ENCOUNTER — Encounter: Payer: Self-pay | Admitting: Internal Medicine

## 2017-02-26 VITALS — BP 116/66 | HR 68 | Temp 97.7°F | Resp 14 | Ht 63.0 in | Wt 217.0 lb

## 2017-02-26 DIAGNOSIS — Z0000 Encounter for general adult medical examination without abnormal findings: Secondary | ICD-10-CM | POA: Diagnosis not present

## 2017-02-26 DIAGNOSIS — R739 Hyperglycemia, unspecified: Secondary | ICD-10-CM

## 2017-02-26 DIAGNOSIS — Z114 Encounter for screening for human immunodeficiency virus [HIV]: Secondary | ICD-10-CM

## 2017-02-26 LAB — LIPID PANEL
CHOLESTEROL: 192 mg/dL (ref 0–200)
HDL: 48.1 mg/dL (ref >39.00)
LDL Cholesterol: 108 mg/dL — ABNORMAL HIGH (ref 0–99)
NonHDL: 143.67
Total CHOL/HDL Ratio: 4
Triglycerides: 176 mg/dL — ABNORMAL HIGH (ref 0.0–149.0)
VLDL: 35.2 mg/dL (ref 0.0–40.0)

## 2017-02-26 LAB — BASIC METABOLIC PANEL
BUN: 14 mg/dL (ref 6–23)
CALCIUM: 9.5 mg/dL (ref 8.4–10.5)
CO2: 27 mEq/L (ref 19–32)
Chloride: 105 mEq/L (ref 96–112)
Creatinine, Ser: 0.97 mg/dL (ref 0.40–1.20)
GFR: 63.87 mL/min (ref >60.00)
Glucose, Bld: 118 mg/dL — ABNORMAL HIGH (ref 70–99)
POTASSIUM: 4.4 meq/L (ref 3.5–5.1)
Sodium: 138 mEq/L (ref 135–145)

## 2017-02-26 LAB — CBC WITH DIFFERENTIAL/PLATELET
BASOS PCT: 0.5 % (ref 0.0–3.0)
Basophils Absolute: 0 10*3/uL (ref 0.0–0.1)
EOS PCT: 2.6 % (ref 0.0–5.0)
Eosinophils Absolute: 0.2 10*3/uL (ref 0.0–0.7)
HEMATOCRIT: 45 % (ref 36.0–46.0)
HEMOGLOBIN: 15 g/dL (ref 12.0–15.0)
LYMPHS PCT: 27.2 % (ref 12.0–46.0)
Lymphs Abs: 1.8 10*3/uL (ref 0.7–4.0)
MCHC: 33.3 g/dL (ref 30.0–36.0)
MCV: 85.5 fl (ref 78.0–100.0)
MONO ABS: 0.3 10*3/uL (ref 0.1–1.0)
MONOS PCT: 5.2 % (ref 3.0–12.0)
Neutro Abs: 4.2 10*3/uL (ref 1.4–7.7)
Neutrophils Relative %: 64.5 % (ref 43.0–77.0)
Platelets: 285 10*3/uL (ref 150.0–400.0)
RBC: 5.27 Mil/uL — AB (ref 3.87–5.11)
RDW: 14 % (ref 11.5–15.5)
WBC: 6.5 10*3/uL (ref 4.0–10.5)

## 2017-02-26 LAB — HEMOGLOBIN A1C: Hgb A1c MFr Bld: 5.9 % (ref 4.6–6.5)

## 2017-02-26 LAB — TSH: TSH: 3.62 u[IU]/mL (ref 0.35–4.50)

## 2017-02-26 NOTE — Assessment & Plan Note (Signed)
Hyperglycemia: Check A1c Elevated LFTs: Gynecology already refer to GI. RTC one year

## 2017-02-26 NOTE — Assessment & Plan Note (Addendum)
--  Td 2014 --female care f/u by gyn,  MMG 09-2016 --CC[facility]: [facility], no copy of the actual report, was told 5 years --diet,exercise: Diet needs improvement, counseled. She likes to lose weight, her priority should be decrease calorie intake, recommend to use an calorie counting app; exercise is not a priority b/c she is active at work --Labs: HIV, BMP, FLP, CBC, A1c, TSH

## 2017-02-26 NOTE — Progress Notes (Signed)
Subjective:    Patient ID: Jasmine Campbell, female    DOB: 12-28-1965, 51 y.o.   MRN: [id number]  DOS:  02/26/2017 Type of visit - description : cpx Interval history: In general feeling well. Admits that her diet has not been the best, has gained weight. She is active at work. No routine exercise   Review of Systems + Menopausal symptoms, under the care off gynecology.   Other than above, a 14 point review of systems is negative    Past Medical History:  Diagnosis Date  . Chronic kidney disease   . Elevated LFTs   . History of chicken pox   . Morbid obesity (HCC)   . Seasonal allergies   . Vertigo     Past Surgical History:  Procedure Laterality Date  . CESAREAN SECTION     x1  . INTRAUTERINE DEVICE INSERTION  2002   PARAGUARD T-380  . OVARIAN CYST REMOVAL    . PELVIC LAPAROSCOPY      Social History   Social History  . Marital status: Single    Spouse name: N/A  . Number of children: 1  . Years of education: N/A   Occupational History  . Housekeeping [employer]   Social History Main Topics  . Smoking status: Never Smoker  . Smokeless tobacco: Never Used  . Alcohol use 0.0 oz/week     Comment: occasional  . Drug use: No  . Sexual activity: Yes    Partners: Male    Birth control/ protection: IUD     Comment: MIRENA   Other Topics Concern  . Not on file   Social History Narrative   Her adult son lives in [place]. She is from [place], came to the [place] in 2000.   Lives w/ ex-husband and son      Allergies as of 02/26/2017   No Known Allergies     Medication List       Accurate as of 02/26/17  9:03 AM. Always use your most recent med list.          levonorgestrel 20 MCG/24HR IUD Commonly known as:  MIRENA 1 each by Intrauterine route once.      Family History  Problem Relation Age of Onset  . Diabetes Mother        DIAGNOSED IN 2005  . Hypertension Mother   . Diabetes Brother   . Heart failure Paternal Grandfather    . Colon cancer Neg Hx   . Breast cancer Neg Hx        Objective:   Physical Exam BP 116/66 (BP Location: Left Arm, Patient Position: Sitting, Cuff Size: Normal)   Pulse 68   Temp 97.7 F (36.5 C) (Oral)   Resp 14   Ht 5\' 3"  (1.6 m)   Wt 217 lb (98.4 kg)   LMP 01/29/2017   SpO2 98%   BMI 38.44 kg/m   General:   Well developed, overweight appearing, no distress Neck: No  thyromegaly  HEENT:  Normocephalic . Face symmetric, atraumatic Lungs:  CTA B Normal respiratory effort, no intercostal retractions, no accessory muscle use. Heart: RRR,  no murmur.  No pretibial edema bilaterally  Abdomen:  Not distended, soft, non-tender. No rebound or rigidity.   Skin: Exposed areas without rash. Not pale. Not jaundice Neurologic:  alert & oriented X3.  Speech normal, gait appropriate for age and unassisted Strength symmetric and appropriate for age.  Psych: Cognition and judgment appear intact.  Cooperative with normal attention span  and concentration.  Behavior appropriate. No anxious or depressed appearing.    Assessment & Plan:   Assessment  (new pt 02-2016) Hyperglycemia, A1c 6. 01-2016 Elevated LFTs:  --Nl ferritin 12-2014 Hep B, hep C negative 11-2014, US liver wnl 12-2014 --was dx fatty liver per pt Morbid obesity (BMI 38, hyperglicemia, fatty liver) Sickle cell trait (see labs at care everywhere) H/o Vertigo x 2 2016 IUD in place   PLAN: Hyperglycemia: Check A1c Elevated LFTs: Gynecology already refer to GI. RTC one year

## 2017-02-26 NOTE — Progress Notes (Signed)
Pre visit review using our clinic review tool, if applicable. No additional management support is needed unless otherwise documented below in the visit note. 

## 2017-02-26 NOTE — Patient Instructions (Signed)
GO TO THE LAB : Get the blood work     GO TO THE FRONT DESK Schedule your next appointment for a  Physical in 1 year  

## 2017-02-27 ENCOUNTER — Other Ambulatory Visit: Payer: Self-pay | Admitting: Anesthesiology

## 2017-02-27 DIAGNOSIS — Z1211 Encounter for screening for malignant neoplasm of colon: Secondary | ICD-10-CM

## 2017-02-27 LAB — HIV ANTIBODY (ROUTINE TESTING W REFLEX): HIV 1&2 Ab, 4th Generation: NONREACTIVE

## 2017-03-13 ENCOUNTER — Ambulatory Visit (INDEPENDENT_AMBULATORY_CARE_PROVIDER_SITE_OTHER): Payer: BLUE CROSS/BLUE SHIELD | Admitting: Gastroenterology

## 2017-03-13 ENCOUNTER — Encounter: Payer: Self-pay | Admitting: Gastroenterology

## 2017-03-13 ENCOUNTER — Telehealth: Payer: Self-pay

## 2017-03-13 VITALS — BP 130/94 | HR 76 | Ht 62.4 in | Wt 219.4 lb

## 2017-03-13 DIAGNOSIS — R945 Abnormal results of liver function studies: Principal | ICD-10-CM

## 2017-03-13 DIAGNOSIS — Z7289 Other problems related to lifestyle: Secondary | ICD-10-CM

## 2017-03-13 DIAGNOSIS — Z789 Other specified health status: Secondary | ICD-10-CM | POA: Diagnosis not present

## 2017-03-13 DIAGNOSIS — R7989 Other specified abnormal findings of blood chemistry: Secondary | ICD-10-CM | POA: Diagnosis not present

## 2017-03-13 NOTE — Telephone Encounter (Signed)
[facility]. will await appointment information.

## 2017-03-13 NOTE — Progress Notes (Signed)
[facility] Consult Note:  HistoryIona Campbell: Jasmine Campbell 03/13/2017  Referring physician: Wanda PlumpPaz, Jasmine E, MD  Reason for consult/chief complaint: elevated LFT's; Abdominal Pain (epigactric burning, with egg, milk, cheeses); and Pruritis (intermittent itching all over)   Subjective  HPI:  This is a 51 year old woman referred by primary care noted above for abnormal LFTs. Records from the local GI group in [place] show that she has had elevated LFTs dating back to at least 2014. It was modest transaminitis with significant elevation of GGT. She was felt to be using alcohol excessively, and was repeatedly counseled to become abstinent. She had an extensive serologic workup and ultrasound as well. Iron level was mildly elevated in autoimmune markers were negative. She continues to have alcohol most weeks, and if she goes to a party might have 5 or 6 drinks. She likes tequila mixed with beer. She also finds it difficult to lose weight and does not feel that she eats enough to warrant the weight gain she has had this year. Jasmine PandySoledad reports that dairy products seemed to cause an epigastric burning and bloating, but she typically does not avoid them because she likes eating those things. She denies nausea or vomiting, dysphagia odynophagia or early satiety. Records indicate that she is up-to-date on colon cancer screening with a "benign polyp" of unknown pathology removed during a colonoscopy in the fall of 2015. This information is taken from a letter she was mailed after her last procedure. She was advised to have a repeat colonoscopy at a 5 year interval, which would be late 2020.  ROS:  Review of Systems  Constitutional: Negative for appetite change and unexpected weight change.  HENT: Negative for mouth sores and voice change.   Eyes: Negative for pain and redness.  Respiratory: Negative for cough and shortness of breath.   Cardiovascular: Negative for chest pain  and palpitations.  Genitourinary: Negative for dysuria and hematuria.  Musculoskeletal: Negative for arthralgias and myalgias.  Skin: Negative for pallor and rash.  Neurological: Negative for weakness and headaches.  Hematological: Negative for adenopathy.     Past Medical History: Past Medical History:  Diagnosis Date  . Elevated LFTs   . History of chicken pox   . Morbid obesity (HCC)   . Seasonal allergies   . Vertigo      Past Surgical History: Past Surgical History:  Procedure Laterality Date  . CESAREAN SECTION     x1  . INTRAUTERINE DEVICE INSERTION  2002   PARAGUARD T-380  . OVARIAN CYST REMOVAL    . PELVIC LAPAROSCOPY       Family History: Family History  Problem Relation Age of Onset  . Diabetes Mother        DIAGNOSED IN 2005  . Hypertension Mother   . Heart attack Mother   . Prostatitis Father   . Colon cancer Neg Hx   . Breast cancer Neg Hx     Social History: Social History   Social History  . Marital status: Single    Spouse name: N/A  . Number of children: 1  . 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 Smoking status: Never Smoker  . Smokeless tobacco: Never Used  . Alcohol use 0.0 oz/week     Comment: occasional beer  . Drug use: No  . Sexual activity: Yes    Partners: Male    Birth control/ protection: IUD  Comment: MIRENA   Other Topics Concern  . None   Social History Narrative   Her adult son lives in [place]. She is from [place], came to the [place] in 2000.   Lives w/ ex-husband      Allergies: No Known Allergies  Outpatient Meds: Current Outpatient Prescriptions  Medication Sig Dispense Refill  . cetirizine (ZYRTEC) 10 MG tablet Take 10 mg by mouth as needed (itching).    Marland Kitchen levonorgestrel (MIRENA) 20 MCG/24HR IUD 1 each by Intrauterine route once.     No current facility-administered medications for this visit.        ___________________________________________________________________ Objective   Exam:  BP (!) 130/94 (BP Location: Right Arm, Patient Position: Sitting, Cuff Size: Large)   Pulse 76   Ht 5' 2.4" (1.585 m) Comment: height measured without shoes  Wt 219 lb 6 oz (99.5 kg)   BMI 39.61 kg/m   Her husband and a Spanish interpreter were present for the entire encounter.  General: this is a(n) obese middle-aged woman   Eyes: sclera anicteric, no redness  ENT: oral mucosa moist without lesions, no cervical or supraclavicular lymphadenopathy, good dentition  CV: RRR without murmur, S1/S2, no JVD, no peripheral edema  Resp: clear to auscultation bilaterally, normal RR and effort noted  GI: soft, no tenderness, with active bowel sounds. No guarding or palpable organomegaly noted.  Skin; warm and dry, no rash or jaundice noted. No spider nevi or palmar erythema  Neuro: awake, alert and oriented x 3. Normal gross motor function and fluent speech  Labs:  CMP Latest Ref Rng & Units 02/26/2017 02/02/2017 05/23/2016  Glucose 70 - 99 mg/dL 161(W) - -  BUN 6 - 23 mg/dL 14 - -  Creatinine 9.60 - 1.20 mg/dL 4.54 - -  Sodium 098 - 145 mEq/L 138 - -  Potassium 3.5 - 5.1 mEq/L 4.4 - -  Chloride 96 - 112 mEq/L 105 - -  CO2 19 - 32 mEq/L 27 - -  Calcium 8.4 - 10.5 mg/dL 9.5 - -  Total Protein 6.1 - 8.1 g/dL - - -  Total Bilirubin 0.2 - 1.2 mg/dL - - -  Alkaline Phos 33 - 130 U/L - - -  AST 10 - 35 U/L - 23 24  ALT 6 - 29 U/L - 42(H) 44(H)   CBC Latest Ref Rng & Units 02/26/2017 01/25/2016 12/09/2014  WBC 4.0 - 10.5 K/uL 6.5 4.7 5.8  Hemoglobin 12.0 - 15.0 g/dL 11.9 14.7 82.9  Hematocrit 36.0 - 46.0 % 45.0 40.5 43.8  Platelets 150.0 - 400.0 K/uL 285.0 243 276   She is hepatitis B immune based on serology, and hepatitis C negative.  Radiologic Studies:  Abdominal ultrasound in April 2016 apparently showed gallstones but was otherwise normal. These gallstones were not felt to be  symptomatic.  Assessment: Encounter Diagnoses  Name Primary?  Marland Kitchen LFT elevation Yes  . Morbid obesity (HCC)   . 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 She was strongly advised to be abstinent from alcohol and make significant efforts at weight loss through diet and exercise. To that end, she was referred to the dietitian. LFTs are minimal, no clinical indications of cirrhosis. Plan:  No further testing needed at this time. I expect she will be recalled by her previous GI practice for surveillance colonoscopy at the appropriate interval.  Total time 30 minutes, over half spent reviewing previous  records and discussing diagnosis with plan. Additional time required due to length which barrier an interpreter services. Thank you for the courtesy of this consult.  Please call me with any questions or concerns.  Charlie Pitter III  CC: Jasmine Plump, MD  Reynaldo Minium, M.D.

## 2017-03-13 NOTE — Patient Instructions (Signed)
If you are age 53 or older, your body mass index should be between 23-30. Your Body mass index is 39.61 kg/m. If this is out of the aforementioned range listed, please consider follow up with your Primary Care Provider.  If you are age 53 or younger, your body mass index should be between 19-25. Your Body mass index is 39.61 kg/m. If this is out of the aformentioned range listed, please consider follow up with your Primary Care Provider.   We have [facility] a [facility] to [facility].  If you haven't heard from them within 2 weeks, please give us a call.  [phone]  Thank you for choosing [facility]  Dr Amada JupiterHenry Danis III

## 2017-03-27 NOTE — Telephone Encounter (Addendum)
Pt is scheduled on 04-10-2017 @ 2 pm with Heywood BeneAngela Johnston.

## 2017-04-10 ENCOUNTER — Encounter: Payer: Self-pay | Admitting: Registered"

## 2017-04-10 ENCOUNTER — Encounter: Payer: BLUE CROSS/BLUE SHIELD | Attending: Gastroenterology | Admitting: Registered"

## 2017-04-10 DIAGNOSIS — R7989 Other specified abnormal findings of blood chemistry: Secondary | ICD-10-CM | POA: Diagnosis not present

## 2017-04-10 DIAGNOSIS — Z713 Dietary counseling and surveillance: Secondary | ICD-10-CM | POA: Insufficient documentation

## 2017-04-10 DIAGNOSIS — R7303 Prediabetes: Secondary | ICD-10-CM

## 2017-04-10 NOTE — Patient Instructions (Addendum)
Continue the physical activity and classes at the gym Continue having balanced meals  Try some ways to reduce anxiety during brakes to help curb sugar cravings. Listen to music, etc Consider trying Fairlife milk products, may be easier on stomach Limit the yuka to very small serving sizes and no other carbohyrates with the meal.

## 2017-04-10 NOTE — Progress Notes (Signed)
Medical Nutrition Therapy:  Appt start time: 1400 end time:  1500.  Assessment:  Primary concerns today: Pt states she has fatty liver and has been trying to lose weight.   Pt smiled when she was asked about what she has for a snack. Pt states the reason why is because she really likes chocolate and candy, but not a chip eater. Pt reports that she eats for anxiety. Pt states she knows she is pre-diabetic and wants to avoid candy, but she really, really likes it.  Fatigue: sometimes feels plenty of energy. Some days feels very tired - (heavy like when on period)  Sleep: wakes up at 3 am for work and gets 6-7 hours. Wants to spend time with family or friends and time gets away from her so she doesn't go to bed sooner.  Stress: Pt reports she is a little stressed about applying for citizenship.  Preferred Learning Style:   No preference indicated   Learning Readiness:   Ready  MEDICATIONS: reviewed   DIETARY INTAKE:  Usual eating pattern includes 3 meals and 2-3 snacks per day.  Pt states she loves rice, but per doctors advice she avoids and when she does eat will have brown rice (pt called it "diet").  Pt states dairy makes her feel full "inflamed" (bloated maybe? Translation?) eggs makes her feel nauseated.   24-hr recall:  B ( AM): at work coffee & soy milk, 2 slices of whole meal bread OR at home egg, coffee milk, 2 slices bread Snk ( AM): activia with cereal bar with chocolate OR apple peanut butter  L (10:30 PM): rice, salad, chicken, fish OR potatoes, broccoli, meat  Snk ( PM): nuts with chocolate OR cheese candy OR apple with candy D ( PM): spinach salad, grilled meat, yuka, fried banana (sometimes but doctor said not to eat fried food) Snk ( PM): cereal, bananas OR fruit OR ice cream  Beverages: water, coffee   Usual physical activity: 3-4x/wk goes to gym 1 hr does Zumba, bicycle, walking  Estimated energy needs: 1600 calories 180 g carbohydrates 120 g protein 44 g  fat  Progress Towards Goal(s):  In progress.   Nutritional Diagnosis:  NI-5.8.4 Inconsistent carbohydrate intake As related to snacks that are mostly carbohydrate and preference for high carb starches such as rice & yuka.  As evidenced by diet recall and A1c of 5.9.    Intervention:  Nutrition Education for managing blood glucose with diet and lifestyle changes. Discussed balanced meals. Stated what foods contain the most carbohydrates.  Demonstrated carbohydrate counting. Described other factors that affect weight and our food choices such as exercise, sleep and stress.  Plan: Continue the physical activity and classes at the gym Continue having balanced meals  Try some ways to reduce anxiety during brakes to help curb sugar cravings. Listen to music, etc Consider trying Fairlife milk products may be better on her stomach Limit the yuka to very small serving sizes and no other carbohyrates with the meal  Teaching Method Utilized:  Visual Auditory  Handouts given during visit include: (in Spanish)  My Plate  Carb sheet  Barriers to learning/adherence to lifestyle change: none  Demonstrated degree of understanding via:  Teach Back   Monitoring/Evaluation:  Dietary intake, exercise, and body weight prn.

## 2018-01-25 ENCOUNTER — Encounter (HOSPITAL_COMMUNITY): Payer: Self-pay | Admitting: Emergency Medicine

## 2018-01-25 ENCOUNTER — Emergency Department (HOSPITAL_COMMUNITY)
Admission: EM | Admit: 2018-01-25 | Discharge: 2018-01-26 | Disposition: A | Discharge status: Home / Self Care | Payer: PRIVATE HEALTH INSURANCE | Attending: Emergency Medicine | Admitting: Emergency Medicine

## 2018-01-25 ENCOUNTER — Emergency Department (HOSPITAL_COMMUNITY): Payer: PRIVATE HEALTH INSURANCE

## 2018-01-25 ENCOUNTER — Other Ambulatory Visit: Payer: Self-pay

## 2018-01-25 DIAGNOSIS — J40 Bronchitis, not specified as acute or chronic: Secondary | ICD-10-CM | POA: Insufficient documentation

## 2018-01-25 DIAGNOSIS — J302 Other seasonal allergic rhinitis: Secondary | ICD-10-CM | POA: Diagnosis not present

## 2018-01-25 DIAGNOSIS — Z79899 Other long term (current) drug therapy: Secondary | ICD-10-CM | POA: Insufficient documentation

## 2018-01-25 DIAGNOSIS — R05 Cough: Secondary | ICD-10-CM | POA: Diagnosis present

## 2018-01-25 LAB — CBC
HCT: 43.7 % (ref 36.0–46.0)
HEMOGLOBIN: 14.3 g/dL (ref 12.0–15.0)
MCH: 28.5 pg (ref 26.0–34.0)
MCHC: 32.7 g/dL (ref 30.0–36.0)
MCV: 87.1 fL (ref 78.0–100.0)
Platelets: 314 10*3/uL (ref 150–400)
RBC: 5.02 MIL/uL (ref 3.87–5.11)
RDW: 13.8 % (ref 11.5–15.5)
WBC: 13.8 10*3/uL — AB (ref 4.0–10.5)

## 2018-01-25 LAB — BASIC METABOLIC PANEL
ANION GAP: 6 (ref 5–15)
BUN: 18 mg/dL (ref 6–20)
CALCIUM: 9 mg/dL (ref 8.9–10.3)
CO2: 29 mmol/L (ref 22–32)
Chloride: 107 mmol/L (ref 101–111)
Creatinine, Ser: 1.27 mg/dL — ABNORMAL HIGH (ref 0.44–1.00)
GFR, EST AFRICAN AMERICAN: 55 mL/min — AB (ref >60)
GFR, EST NON AFRICAN AMERICAN: 47 mL/min — AB (ref >60)
Glucose, Bld: 140 mg/dL — ABNORMAL HIGH (ref 65–99)
Potassium: 3.7 mmol/L (ref 3.5–5.1)
Sodium: 142 mmol/L (ref 135–145)

## 2018-01-25 LAB — I-STAT TROPONIN, ED: TROPONIN I, POC: 0 ng/mL (ref 0.00–0.08)

## 2018-01-25 MED ORDER — ALBUTEROL SULFATE (2.5 MG/3ML) 0.083% IN NEBU
5.0000 mg | INHALATION_SOLUTION | Freq: Once | RESPIRATORY_TRACT | Status: AC
  Administered 2018-01-25: 5 mg via RESPIRATORY_TRACT
  Filled 2018-01-25: qty 6

## 2018-01-25 NOTE — ED Triage Notes (Addendum)
Pt reports productive cough with yellow phlegm x 1 week.  Seen at [facility] and told she had "light bronchitis".  Taking antibiotics, steroids, and inhaler.  Seemed to be getting better but symptoms flared up today after using a hair dye.  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  Audible wheezing.  Reports generalized pain across chest that radiates to back.

## 2018-01-26 MED ORDER — IPRATROPIUM-ALBUTEROL 0.5-2.5 (3) MG/3ML IN SOLN
3.0000 mL | Freq: Once | RESPIRATORY_TRACT | Status: AC
Start: 2018-01-26 — End: 2018-01-26
  Administered 2018-01-26: 3 mL via RESPIRATORY_TRACT
  Filled 2018-01-26: qty 3

## 2018-01-26 NOTE — Discharge Instructions (Addendum)
Please read instructions below. Use your albuterol inhaler every 4-6 hours as needed for shortness of breath or wheezing.  Finish the steroids as prescribed until gone. Take allergy medicine daily. Follow up with your primary care provider. Return to the ER if you have shortness of breath not improved by your inhaler, or new or concerning symptoms.

## 2018-01-26 NOTE — ED Provider Notes (Addendum)
[facility] EMERGENCY DEPARTMENT Provider Note   CSN: [id number] Arrival date & time: 01/25/18  2103     History   Chief Complaint Chief Complaint  Patient presents with  . Cough    HPI Jasmine Campbell is a 52 y.o. female history of seasonal allergies, presenting to the ED with 1 week of intermittent cough and wheezing.  She states symptoms initially began after she did some heavy cleaning at work, which showed up lots of dust.  She states she also use lots of chemicals.  She was evaluated at urgent care after that, who prescribed her antibiotics, steroids, albuterol inhaler.  She states symptoms significantly improved, however today she dyed someone's hair and the chemicals caused irritation once more.  She states she feels a chest tightness and some wheezing.  Reports associated nasal congestion.  States she stopped taking allergy medications after urgent care visit.  Denies difficulty breathing, fever, sore throat, or other complaints.  She was given albuterol nebulizer treatment in triage, and she states that provided significant improvement.  The history is provided by the patient.    Past Medical History:  Diagnosis Date  . Elevated LFTs   . History of chicken pox   . Morbid obesity (HCC)   . Seasonal allergies   . Vertigo     Patient Active Problem List   Diagnosis Date Noted  . Annual physical exam 02/26/2017  . Hyperglycemia 09/05/2016  . Morbid obesity (HCC)   . PCP NOTES >>>>>>>>>> 02/18/2016  . Sickle cell trait (HCC) 12/09/2014  . Other and unspecified hyperlipidemia 11/24/2013  . Fatty liver 11/24/2013  . Family history of diabetes mellitus 11/24/2013  . IUD (intrauterine device) in place 11/24/2013    Past Surgical History:  Procedure Laterality Date  . CESAREAN SECTION     x1  . INTRAUTERINE DEVICE INSERTION  2002   PARAGUARD T-380  . OVARIAN CYST REMOVAL    . PELVIC LAPAROSCOPY       OB History    Gravida  1   Para   1   Term  1   Preterm      AB      Living  1     SAB      TAB      Ectopic      Multiple      Live Births  1            Home Medications    Prior to Admission medications   Medication Sig Start Date End Date Taking? Authorizing Provider  cetirizine (ZYRTEC) 10 MG tablet Take 10 mg by mouth as needed (itching).    [provider]  levonorgestrel (MIRENA) 20 MCG/24HR IUD 1 each by Intrauterine route once.    [provider]    Family History Family History  Problem Relation Age of Onset  . Diabetes Mother        DIAGNOSED IN 2005  . Hypertension Mother   . Heart attack Mother   . Prostatitis Father   . Colon cancer Neg Hx   . Breast cancer Neg Hx     Social History Social History   Tobacco Use  . Smoking status: Never Smoker  . Smokeless tobacco: Never Used  Substance Use Topics  . Alcohol use: Yes    Alcohol/week: 0.0 oz    Comment: occasional beer  . Drug use: No     Allergies   Patient has no known allergies.   Review of  Systems Review of Systems  Constitutional: Negative for chills and fever.  HENT: Positive for congestion.   Respiratory: Positive for cough, chest tightness and wheezing. Negative for shortness of breath.   All other systems reviewed and are negative.    Physical Exam Updated Vital Signs BP (!) 110/59   Pulse 78   Temp 98.7 F (37.1 C) (Oral)   Resp 18   SpO2 98%   Physical Exam  Constitutional: She appears well-developed and well-nourished. No distress.  HENT:  Head: Normocephalic and atraumatic.  Mouth/Throat: Uvula is midline and oropharynx is clear and moist.  Eyes: Conjunctivae are normal.  Neck: Normal range of motion. Neck supple.  Cardiovascular: Normal rate, regular rhythm, normal heart sounds and intact distal pulses.  Pulmonary/Chest: Effort normal and breath sounds normal. No stridor. No respiratory distress. She has no wheezes. She has no rales.  Abdominal: Soft.    Neurological: She is alert.  Skin: Skin is warm.  Psychiatric: She has a normal mood and affect. Her behavior is normal.  Nursing note and vitals reviewed.    ED Treatments / Results  Labs (all labs ordered are listed, but only abnormal results are displayed) Labs Reviewed  BASIC METABOLIC PANEL - Abnormal; Notable for the following components:      Result Value   Glucose, Bld 140 (*)    Creatinine, Ser 1.27 (*)    GFR calc non Af Amer 47 (*)    GFR calc Af Amer 55 (*)    All other components within normal limits  CBC - Abnormal; Notable for the following components:   WBC 13.8 (*)    All other components within normal limits  I-STAT TROPONIN, ED    EKG None  Radiology Dg Chest 2 View  Result Date: 01/25/2018 CLINICAL DATA:  52 year old female with cough and wheezing. EXAM: CHEST - 2 VIEW COMPARISON:  None. FINDINGS: Mild chronic interstitial coarsening. No focal consolidation, pleural effusion, or pneumothorax. The cardiac silhouette is within normal limits. No acute osseous pathology. IMPRESSION: No active cardiopulmonary disease. Electronically Signed   By: Elgie Collard M.D.   On: 01/25/2018 23:21    Procedures Procedures (including critical care time)  Medications Ordered in ED Medications  albuterol (PROVENTIL) (2.5 MG/3ML) 0.083% nebulizer solution 5 mg (5 mg Nebulization Given 01/25/18 2154)  ipratropium-albuterol (DUONEB) 0.5-2.5 (3) MG/3ML nebulizer solution 3 mL (3 mLs Nebulization Given 01/26/18 0055)     Initial Impression / Assessment and Plan / ED Course  I have reviewed the triage vital signs and the nursing notes.  Pertinent labs & imaging results that were available during my care of the patient were reviewed by me and considered in my medical decision making (see chart for details).     Patient presenting to the ED with symptoms consistent with seasonal allergies and bronchitis.  Chest x-ray is negative for acute infiltrate.  Lungs clear to  auscultation bilaterally on exam.  Patient with improvement after albuterol nebulizer treatment in triage.  Afebrile, vital signs stable.  Second neb treatment administered in the ED with patient breathing at baseline. Discussed mild elevation in Creatinine. Encouraged PO hydration and PCP follow up for recheck. Recommend patient continue taking steroids as prescribed until gone.  Discussed proper use of inhaler, 1 to 2 puffs every 4-6 hours as needed.  Encouraged she take daily allergy medication.  Follow-up with PCP.  Safe for discharge.  Discussed results, findings, treatment and follow up. Patient advised of return precautions. Patient verbalized understanding and agreed  with plan.  Final Clinical Impressions(s) / ED Diagnoses   Final diagnoses:  Bronchitis  Seasonal allergies    ED Discharge Orders    None       Maniya Donovan, [place] N, PA-C 01/26/18 0116    Demitra Danley, [place] N, PA-C 01/26/18 8295    Azalia Bilis, MD 01/26/18 1009

## 2018-03-07 ENCOUNTER — Other Ambulatory Visit: Payer: Self-pay | Admitting: Obstetrics and Gynecology

## 2018-03-07 DIAGNOSIS — Z1231 Encounter for screening mammogram for malignant neoplasm of breast: Secondary | ICD-10-CM

## 2018-03-28 ENCOUNTER — Ambulatory Visit (HOSPITAL_COMMUNITY): Payer: BLUE CROSS/BLUE SHIELD

## 2018-05-07 ENCOUNTER — Encounter (HOSPITAL_COMMUNITY): Payer: Self-pay

## 2018-05-07 ENCOUNTER — Ambulatory Visit
Admission: RE | Admit: 2018-05-07 | Discharge: 2018-05-07 | Disposition: A | Discharge status: Home / Self Care | Payer: No Typology Code available for payment source | Source: Ambulatory Visit | Attending: Obstetrics and Gynecology | Admitting: Obstetrics and Gynecology

## 2018-05-07 ENCOUNTER — Ambulatory Visit (HOSPITAL_COMMUNITY)
Admission: RE | Admit: 2018-05-07 | Discharge: 2018-05-07 | Disposition: A | Discharge status: Home / Self Care | Payer: Self-pay | Source: Ambulatory Visit | Attending: Obstetrics and Gynecology | Admitting: Obstetrics and Gynecology

## 2018-05-07 VITALS — BP 136/74 | Ht 66.0 in

## 2018-05-07 DIAGNOSIS — Z1231 Encounter for screening mammogram for malignant neoplasm of breast: Secondary | ICD-10-CM

## 2018-05-07 DIAGNOSIS — Z1239 Encounter for other screening for malignant neoplasm of breast: Secondary | ICD-10-CM

## 2018-05-07 NOTE — Progress Notes (Signed)
No complaints today.   Pap Smear: Pap smear not completed today. Last Pap smear was 02/02/2017 and normal. Per patient has no history of an abnormal Pap smear. Last Pap smear result is in Epic.  Physical exam: Breasts Breasts symmetrical. No skin abnormalities bilateral breasts. No nipple retraction bilateral breasts. No nipple discharge bilateral breasts. No lymphadenopathy. No lumps palpated bilateral breasts. No complaints of pain or tenderness on exam. [facility]. Appointment scheduled for Tuesday, May 07, 2018 at 1510.        Pelvic/Bimanual No Pap smear completed today since last Pap smear was 02/02/2017. Pap smear not indicated per B[facility]CP guidelines.   Smoking History: Patient has never smoked.  Patient Navigation: Patient education provided. Access to services [facility]. Spanish interpreter provided.   Colorectal Cancer Screening: Per patient had a colonoscopy completed in 2015. No complaints today. FIT Test given to patient to complete and return to B[facility]CP.  Breast and Cervical Cancer Risk Assessment: Patient has no family history of breast cancer, known genetic mutations, or radiation treatment to the chest before age 54. Patient has no history of cervical dysplasia, immunocompromised, or DES exposure in-utero. Risk Assessment    Risk Scores      05/07/2018   Last edited by: Priscille HeidelbergBrannock, Marcos Peloso P, RN   5-year risk: 0.8 %   Lifetime risk: 5.8 %         Used Spanish interpreter Nile RiggsMariel Gallego from [facility].

## 2018-05-07 NOTE — Patient Instructions (Signed)
Explained breast self awareness with [facility]. Patient did not need a Pap smear today due to last Pap smear was 02/02/2017. Let her know [facility]CP will cover Pap smears every 3 years unless has a history of abnormal Pap smears. [facility]. Appointment scheduled for Tuesday, May 07, 2018 at 1510. Let patient know the Breast Center will follow up with her within the next couple weeks with results of mammogram by letter or phone. Wanona Moncaleano-Murillo verbalized understanding.  Brannock, Kathaleen Maserhristine Poll, RN 2:53 PM

## 2018-05-08 ENCOUNTER — Encounter (HOSPITAL_COMMUNITY): Payer: Self-pay | Admitting: *Deleted

## 2018-05-13 IMAGING — DX DG CHEST 2V
2 series · 2 of 2 positions shown · non-contrast
Comparison: None.

CLINICAL DATA: 51-year-old female with cough and wheezing.

EXAM:
CHEST - 2 VIEW

[chest pa]
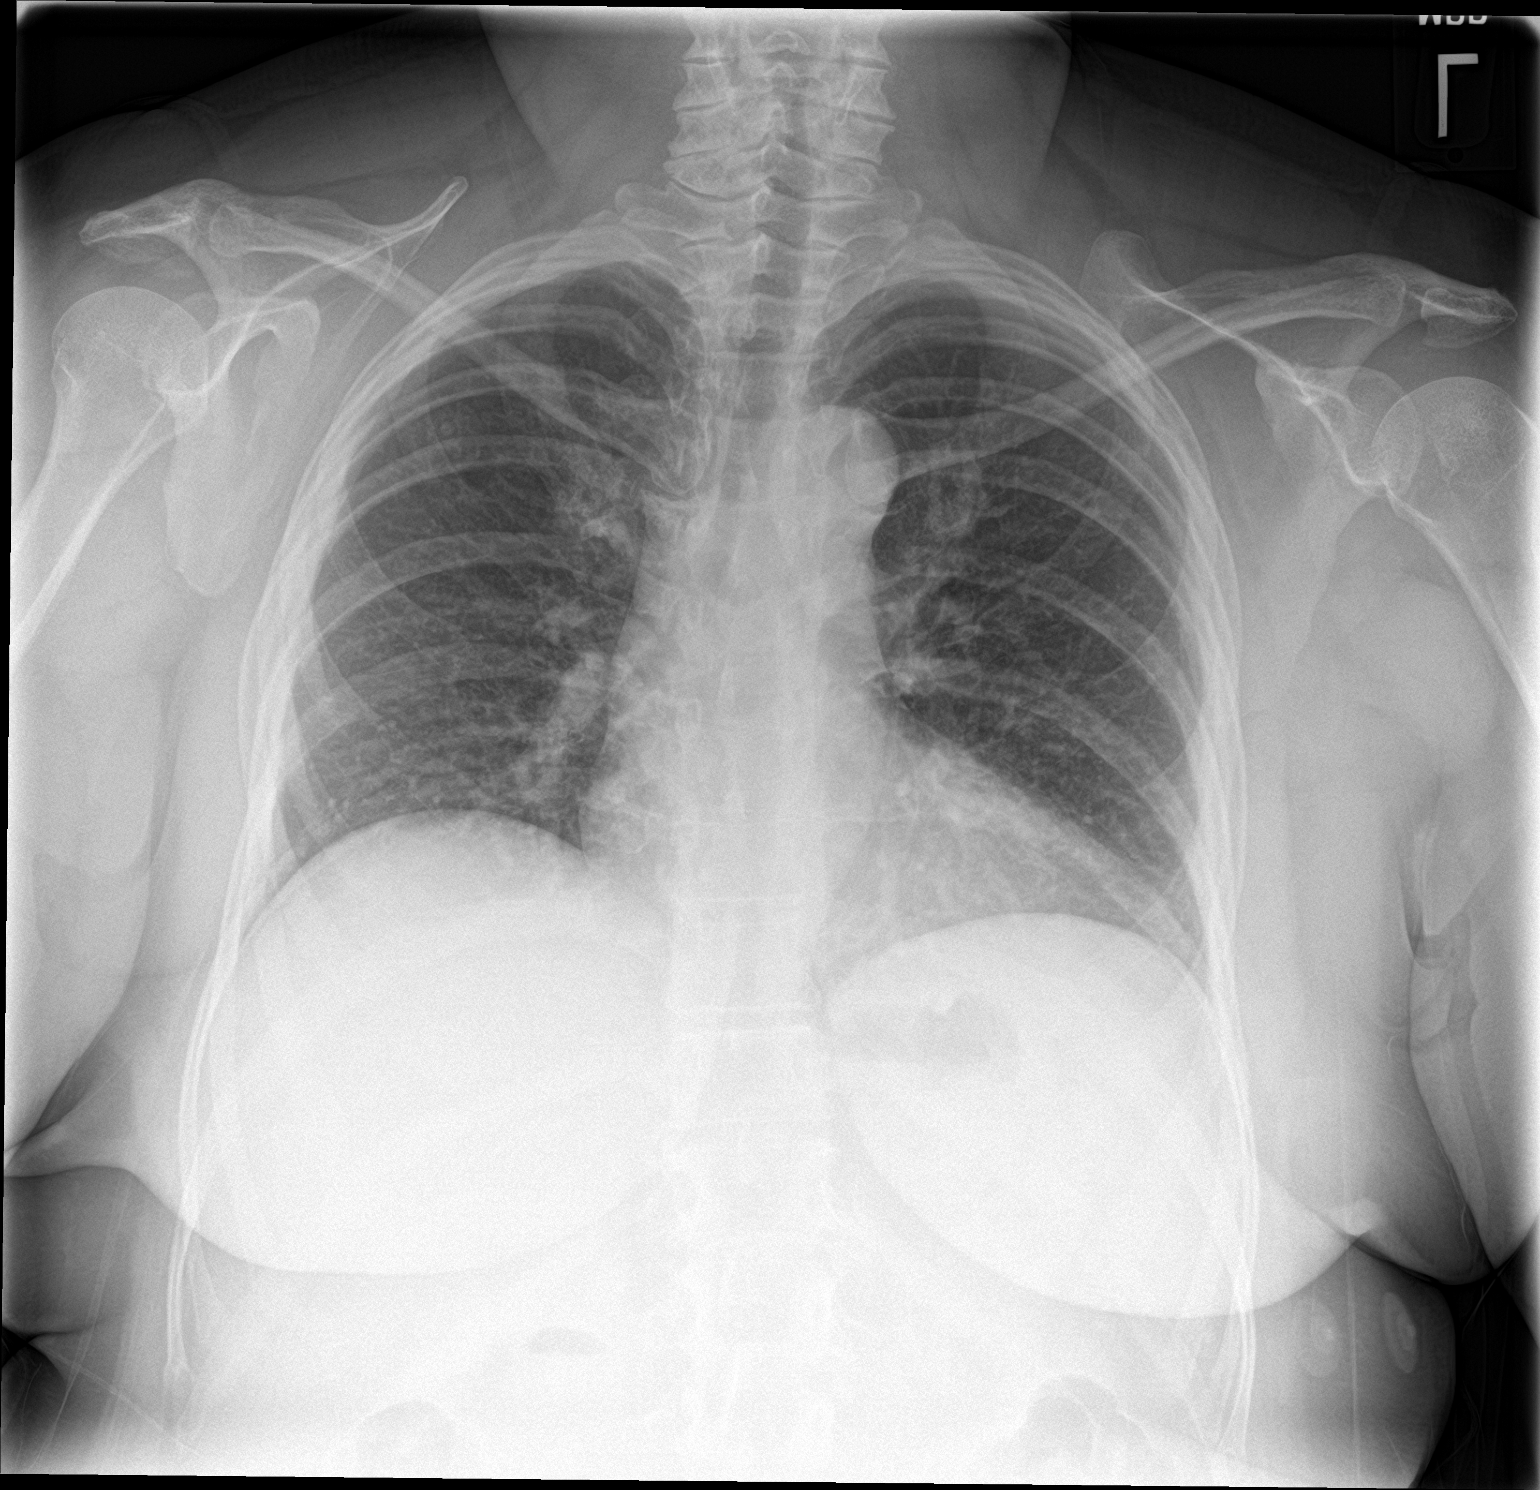

[chest lat]
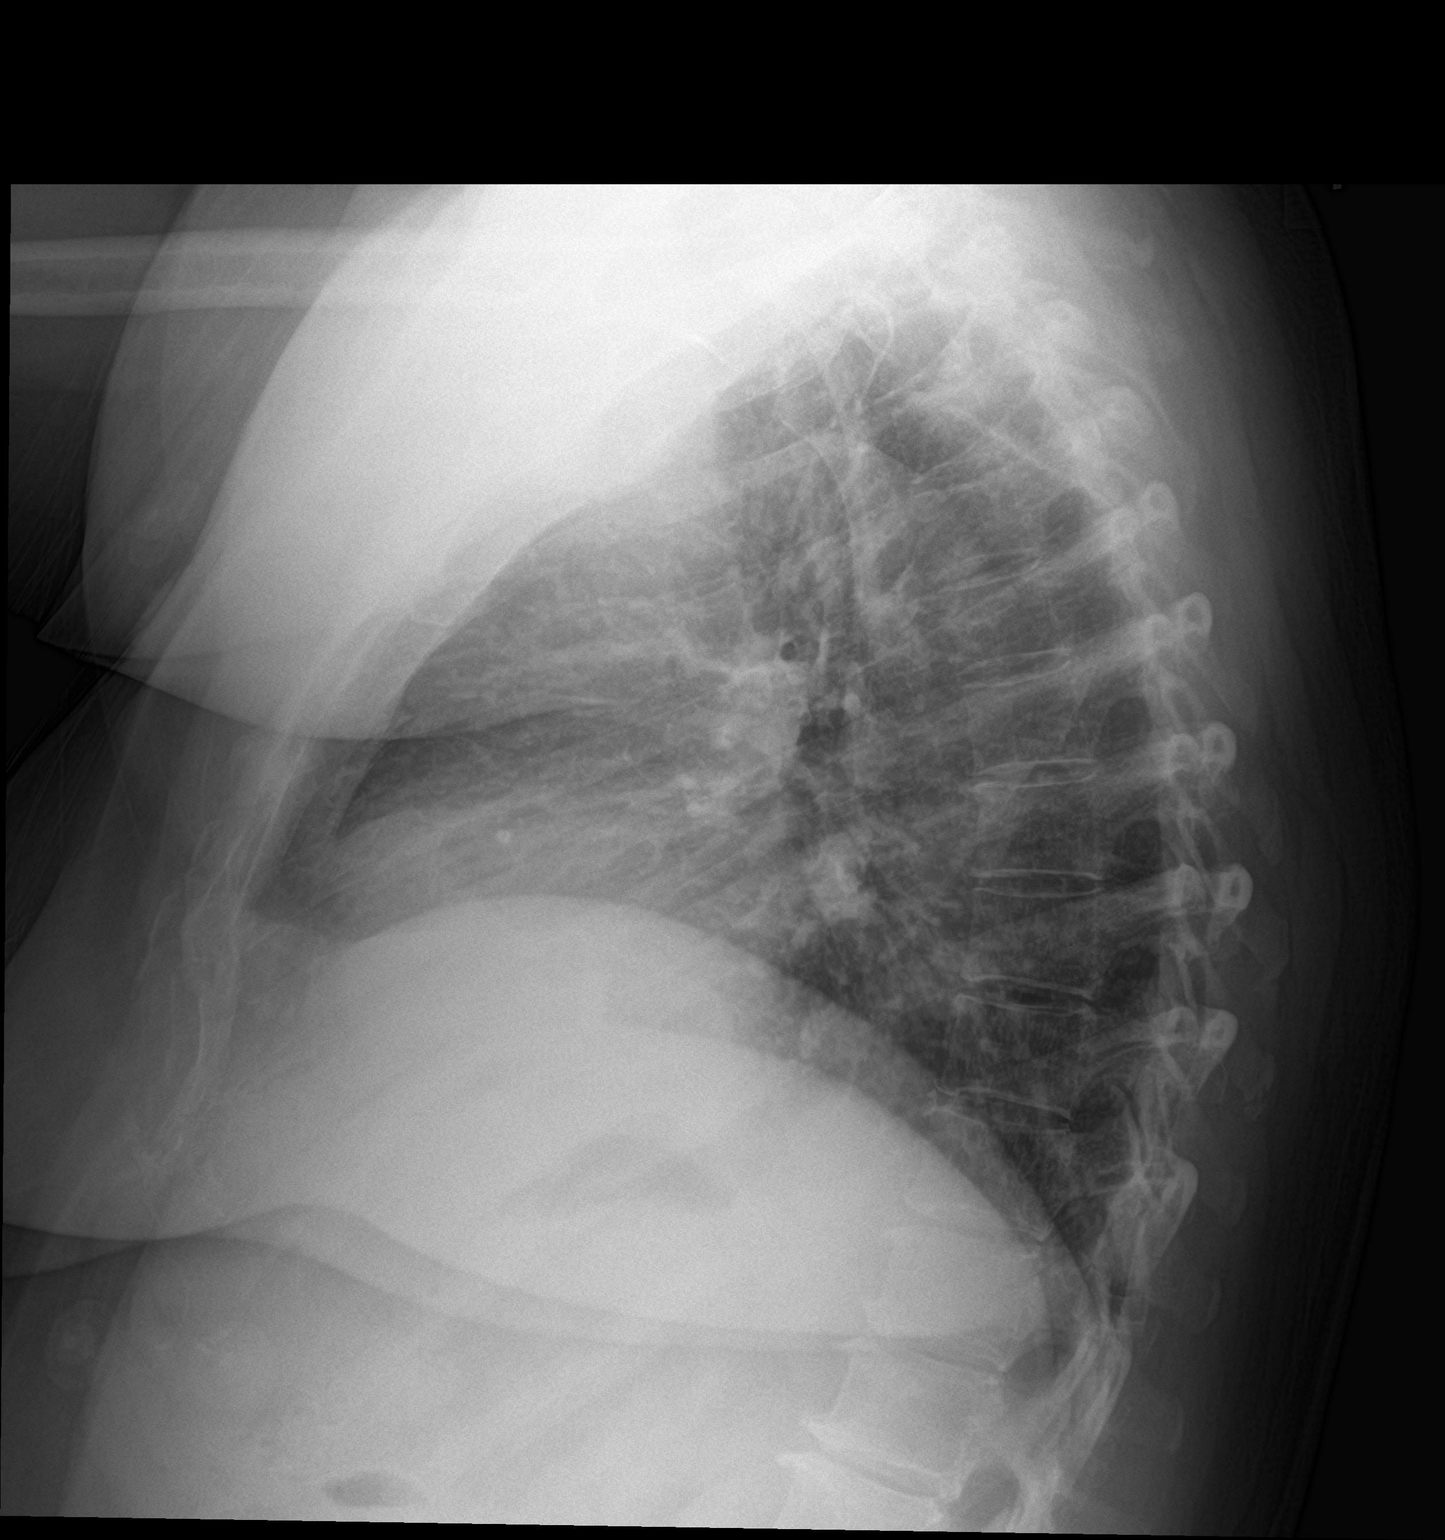

[2 of 2 positions shown; findings below may reference images not displayed]

FINDINGS: Mild chronic interstitial coarsening. No focal consolidation,
pleural effusion, or pneumothorax. The cardiac silhouette is within
normal limits. No acute osseous pathology.
IMPRESSION: No active cardiopulmonary disease.

## 2018-08-23 IMAGING — MG DIGITAL SCREENING BILATERAL MAMMOGRAM WITH TOMO AND CAD
8 series · 9 of 24 positions shown · non-contrast
Comparison: Previous exam(s).

CLINICAL DATA: Screening.

EXAM:
DIGITAL SCREENING BILATERAL MAMMOGRAM WITH TOMO AND CAD

[R MLO synth-2D]
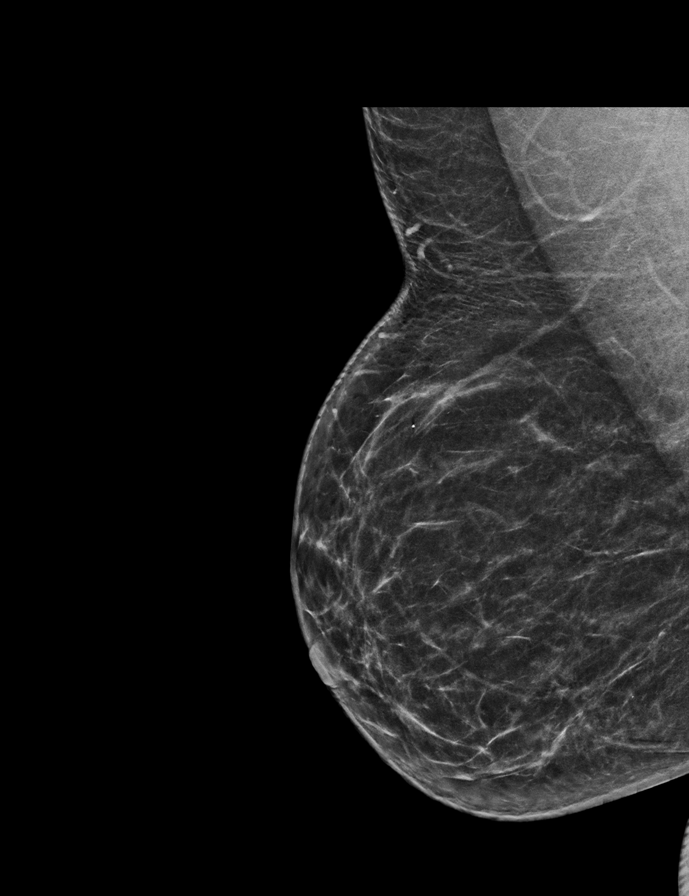

[L MLO synth-2D]
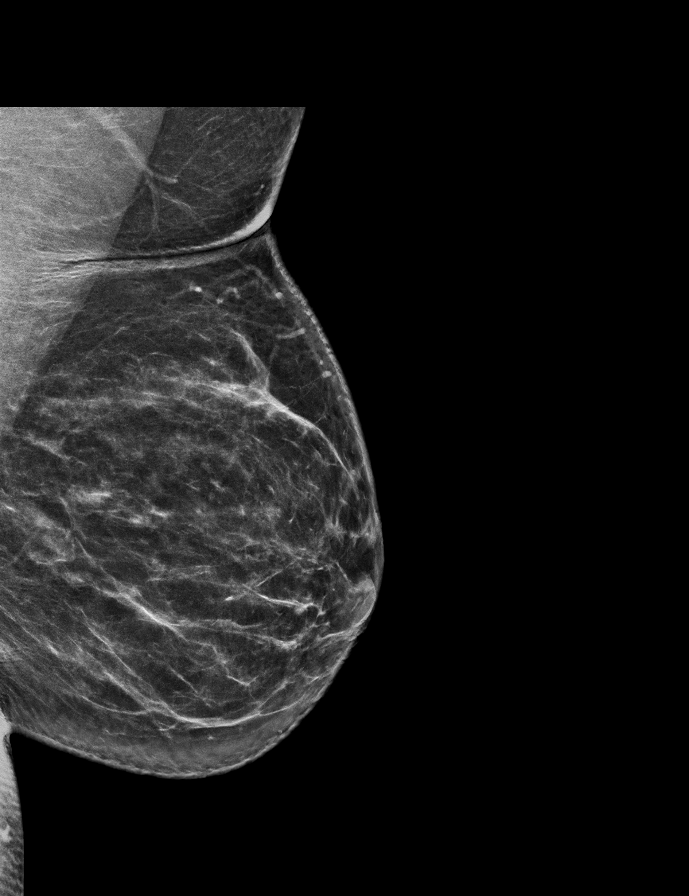

[L CC synth-2D]
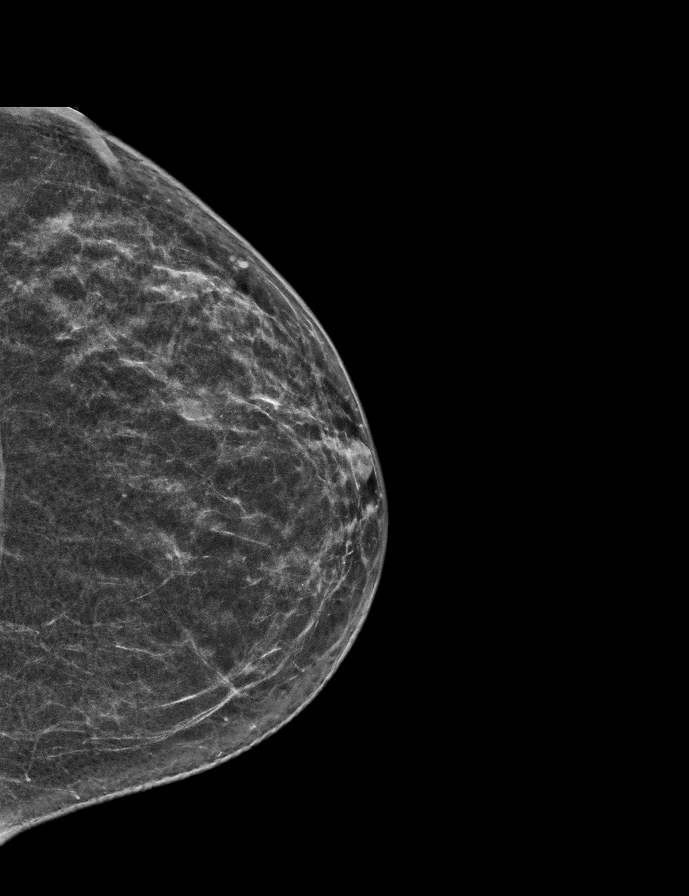

[R CC synth-2D]
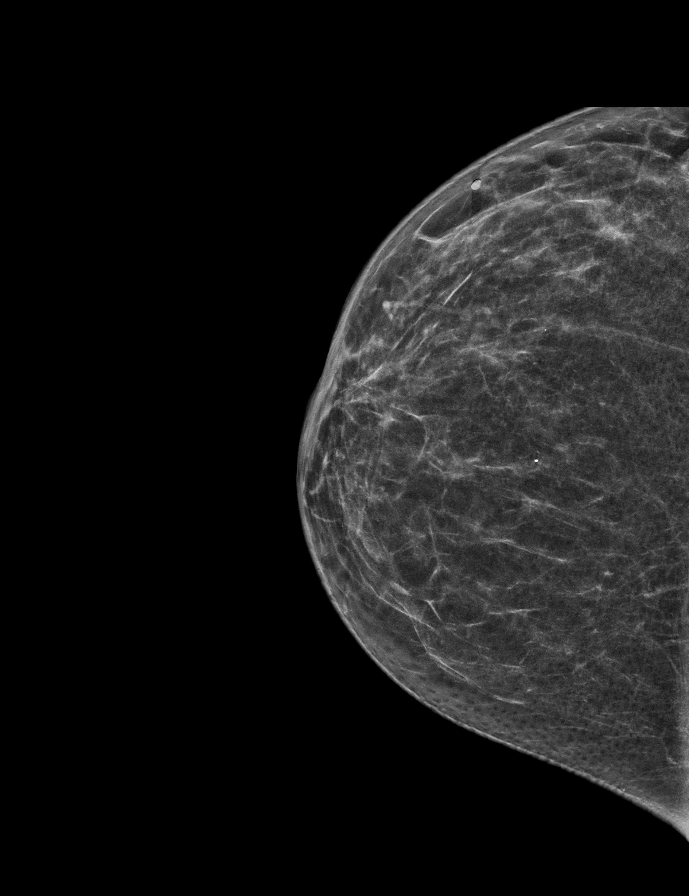

[R MLO tomo · 2 of 65 frames shown]
[frame 21/65]
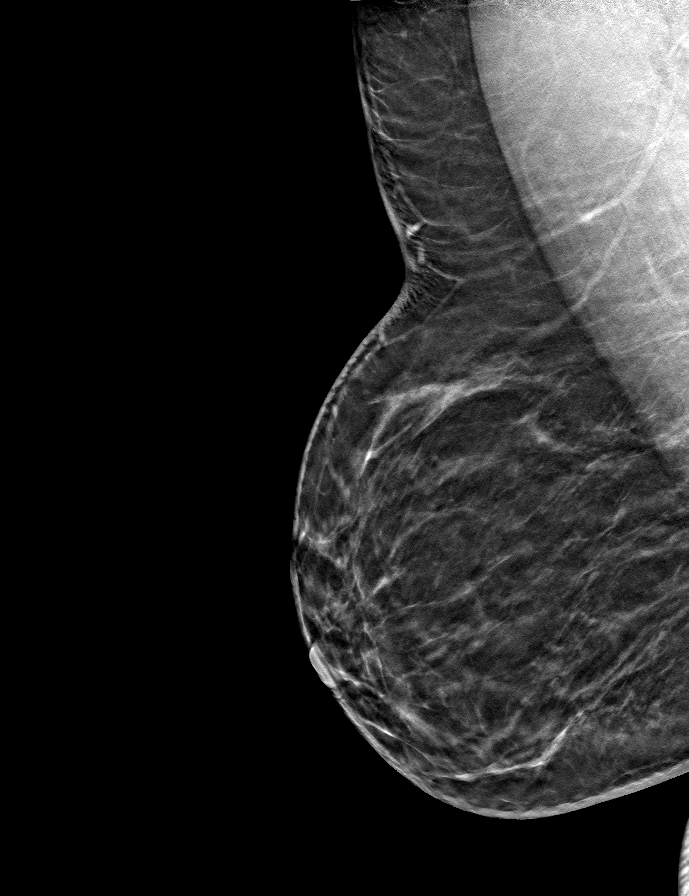
[frame 33/65]
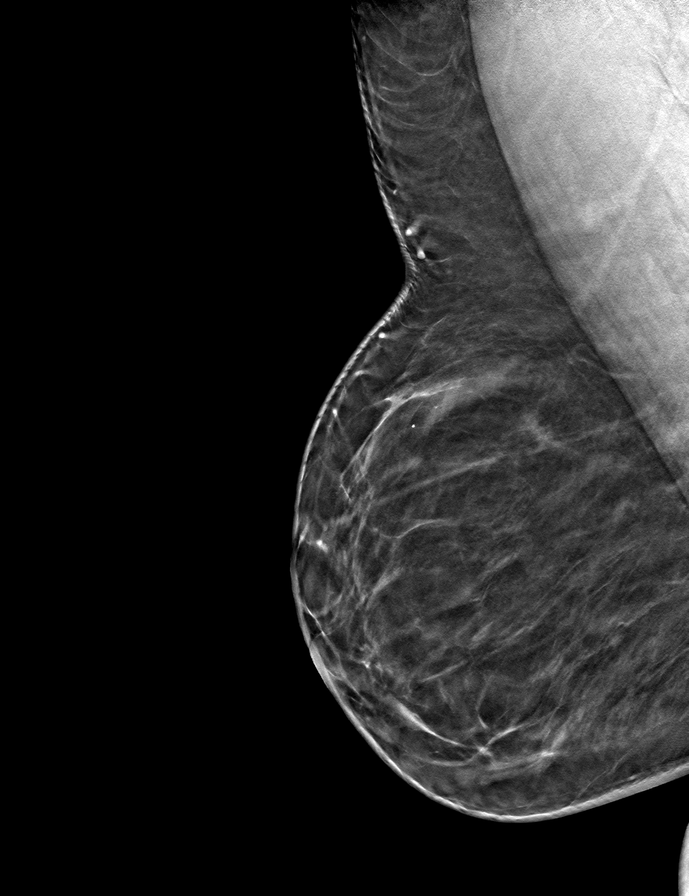

[L CC tomo · tomo slice 30/59.0]
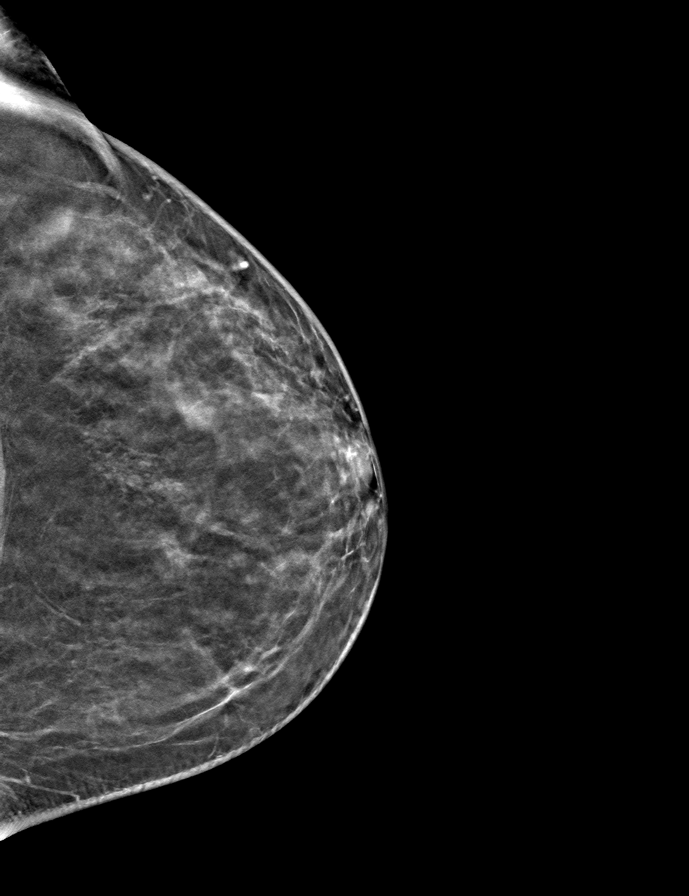

[R CC tomo · tomo slice 27/54.0]
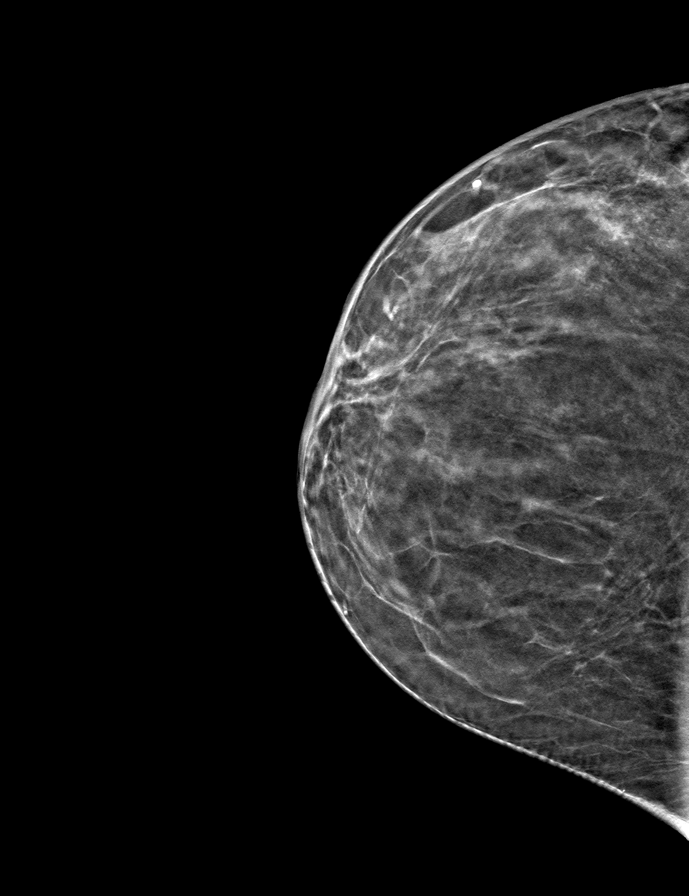

[L MLO tomo · tomo slice 35/69.0]
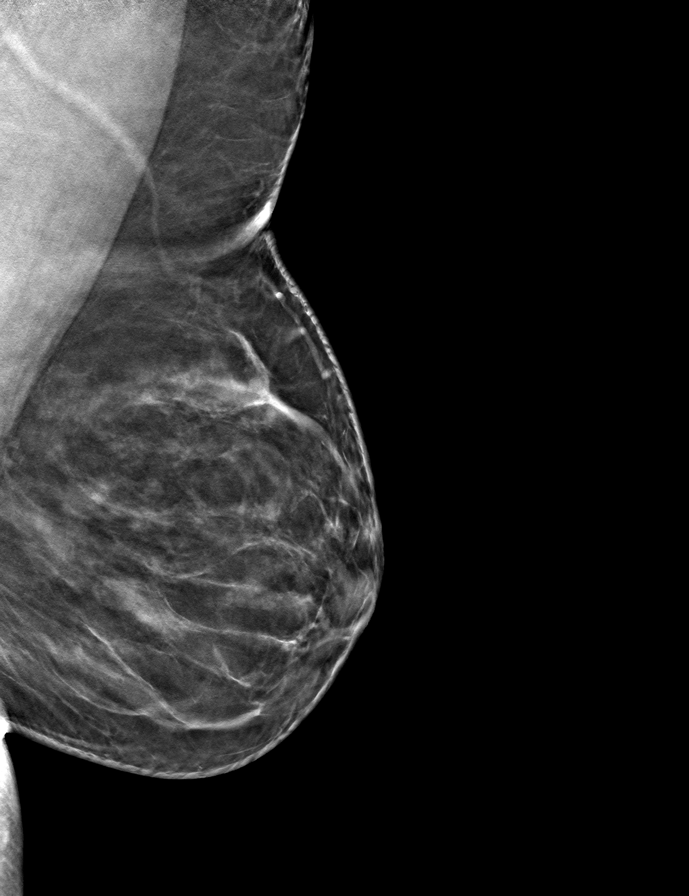

[9 of 24 positions shown; findings below may reference images not displayed]

ACR Breast Density Category b: There are scattered areas of
fibroglandular density.
FINDINGS: There are no findings suspicious for malignancy. Images were
processed with CAD.
IMPRESSION: No mammographic evidence of malignancy. A result letter of this
screening mammogram will be mailed directly to the patient.

RECOMMENDATION:
Screening mammogram in one year. (Code:[id number])

BI-RADS CATEGORY  1: Negative.

## 2019-01-10 ENCOUNTER — Telehealth: Payer: Self-pay

## 2019-01-10 NOTE — Telephone Encounter (Signed)
Last ov 2018. Jasmine Campbell- can you contact Pt to offer virtual visit please?

## 2019-01-10 NOTE — Telephone Encounter (Signed)
Dr. Drue Novel removed as PCP for now.

## 2019-01-10 NOTE — Telephone Encounter (Signed)
Spoke with pt and pt states has new insurance and it is covering in network [facility], pt is going to verify with insurance if she can changed covering that will cover her office visits with Drue Novel. Pt will call when she get health insurance updated, so she can schedule an appt.

## 2023-07-04 ENCOUNTER — Emergency Department (HOSPITAL_BASED_OUTPATIENT_CLINIC_OR_DEPARTMENT_OTHER): Payer: BC Managed Care – PPO

## 2023-07-04 ENCOUNTER — Other Ambulatory Visit: Payer: Self-pay

## 2023-07-04 ENCOUNTER — Encounter (HOSPITAL_BASED_OUTPATIENT_CLINIC_OR_DEPARTMENT_OTHER): Payer: Self-pay | Admitting: Emergency Medicine

## 2023-07-04 DIAGNOSIS — R8289 Other abnormal findings on cytological and histological examination of urine: Secondary | ICD-10-CM | POA: Diagnosis not present

## 2023-07-04 DIAGNOSIS — R1030 Lower abdominal pain, unspecified: Secondary | ICD-10-CM | POA: Diagnosis not present

## 2023-07-04 DIAGNOSIS — R300 Dysuria: Secondary | ICD-10-CM | POA: Insufficient documentation

## 2023-07-04 LAB — COMPREHENSIVE METABOLIC PANEL
ALT: 71 U/L — ABNORMAL HIGH (ref 0–44)
AST: 44 U/L — ABNORMAL HIGH (ref 15–41)
Albumin: 3.7 g/dL (ref 3.5–5.0)
Alkaline Phosphatase: 127 U/L — ABNORMAL HIGH (ref 38–126)
Anion gap: 13 (ref 5–15)
BUN: 9 mg/dL (ref 6–20)
CO2: 22 mmol/L (ref 22–32)
Calcium: 9.2 mg/dL (ref 8.9–10.3)
Chloride: 102 mmol/L (ref 98–111)
Creatinine, Ser: 0.9 mg/dL (ref 0.44–1.00)
GFR, Estimated: >60 mL/min (ref >60)
Glucose, Bld: 162 mg/dL — ABNORMAL HIGH (ref 70–99)
Potassium: 3.7 mmol/L (ref 3.5–5.1)
Sodium: 137 mmol/L (ref 135–145)
Total Bilirubin: 0.8 mg/dL (ref 0.3–1.2)
Total Protein: 7 g/dL (ref 6.5–8.1)

## 2023-07-04 LAB — CBC WITH DIFFERENTIAL/PLATELET
Abs Immature Granulocytes: 0.04 10*3/uL (ref 0.00–0.07)
Basophils Absolute: 0 10*3/uL (ref 0.0–0.1)
Basophils Relative: 0 %
Eosinophils Absolute: 0.1 10*3/uL (ref 0.0–0.5)
Eosinophils Relative: 0 %
HCT: 42.2 % (ref 36.0–46.0)
Hemoglobin: 14.1 g/dL (ref 12.0–15.0)
Immature Granulocytes: 0 %
Lymphocytes Relative: 15 %
Lymphs Abs: 1.7 10*3/uL (ref 0.7–4.0)
MCH: 27.8 pg (ref 26.0–34.0)
MCHC: 33.4 g/dL (ref 30.0–36.0)
MCV: 83.1 fL (ref 80.0–100.0)
Monocytes Absolute: 0.5 10*3/uL (ref 0.1–1.0)
Monocytes Relative: 5 %
Neutro Abs: 9 10*3/uL — ABNORMAL HIGH (ref 1.7–7.7)
Neutrophils Relative %: 80 %
Platelets: 259 10*3/uL (ref 150–400)
RBC: 5.08 MIL/uL (ref 3.87–5.11)
RDW: 13 % (ref 11.5–15.5)
WBC: 11.4 10*3/uL — ABNORMAL HIGH (ref 4.0–10.5)
nRBC: 0 % (ref 0.0–0.2)

## 2023-07-04 LAB — URINALYSIS, ROUTINE W REFLEX MICROSCOPIC
Bilirubin Urine: NEGATIVE
Glucose, UA: NEGATIVE mg/dL
Ketones, ur: NEGATIVE mg/dL
Nitrite: NEGATIVE
Protein, ur: NEGATIVE mg/dL
Specific Gravity, Urine: 1.01 (ref 1.005–1.030)
pH: 6 (ref 5.0–8.0)

## 2023-07-04 LAB — URINALYSIS, MICROSCOPIC (REFLEX)

## 2023-07-04 MED ORDER — PHENAZOPYRIDINE HCL 100 MG PO TABS
95.0000 mg | ORAL_TABLET | Freq: Once | ORAL | Status: AC
  Administered 2023-07-04: 100 mg via ORAL
  Filled 2023-07-04: qty 1

## 2023-07-04 MED ORDER — IBUPROFEN 800 MG PO TABS
800.0000 mg | ORAL_TABLET | Freq: Once | ORAL | Status: AC
  Administered 2023-07-04: 800 mg via ORAL
  Filled 2023-07-04: qty 1

## 2023-07-04 NOTE — ED Notes (Signed)
Pt in lobby c/o "buring when she pees" pacing in room   Orders in for pain

## 2023-07-04 NOTE — ED Triage Notes (Signed)
Pt c/o dysuria, hematuria, lower middle abd pain and low back pain

## 2023-07-05 ENCOUNTER — Emergency Department (HOSPITAL_BASED_OUTPATIENT_CLINIC_OR_DEPARTMENT_OTHER)
Admission: EM | Admit: 2023-07-05 | Discharge: 2023-07-05 | Disposition: A | Discharge status: Home / Self Care | Payer: BC Managed Care – PPO | Attending: Emergency Medicine | Admitting: Emergency Medicine

## 2023-07-05 DIAGNOSIS — R1030 Lower abdominal pain, unspecified: Secondary | ICD-10-CM

## 2023-07-05 DIAGNOSIS — R300 Dysuria: Secondary | ICD-10-CM

## 2023-07-05 MED ORDER — CEPHALEXIN 250 MG PO CAPS
500.0000 mg | ORAL_CAPSULE | Freq: Once | ORAL | Status: AC
  Administered 2023-07-05: 500 mg via ORAL
  Filled 2023-07-05: qty 2

## 2023-07-05 MED ORDER — CEPHALEXIN 500 MG PO CAPS: 500.0000 mg | ORAL_CAPSULE | Freq: Two times a day (BID) | ORAL | 0 refills | Status: AC

## 2023-07-05 NOTE — ED Provider Notes (Signed)
Emergency Department Provider Note   I have reviewed the triage vital signs and the nursing notes.   HISTORY  Chief Complaint Dysuria  Family assisting with translation at bedside per patient request.   HPI Jasmine Campbell is a 57 y.o. female with history reviewed below presents emergency department for evaluation of suprapubic abdominal pain and dysuria.  Symptoms began the past 24 to 48 hours.  She has noticed some blood abdominal pain complaints.  No fevers or chills. No prior history of ureteral stone.    Past Medical History:  Diagnosis Date   Elevated LFTs    History of chicken pox    Morbid obesity (HCC)    Seasonal allergies    Vertigo     Review of Systems  Constitutional: No fever/chills Cardiovascular: Denies chest pain. Respiratory: Denies shortness of breath. Gastrointestinal: No abdominal pain.  Musculoskeletal: Negative for back pain. Skin: Negative for rash. Neurological: Negative for headaches.  ____________________________________________   PHYSICAL EXAM:  VITAL SIGNS: ED Triage Vitals  Encounter Vitals Group     BP 07/04/23 2041 (!) 148/97     Pulse Rate 07/04/23 2041 (!) 105     Resp 07/04/23 2041 18     Temp 07/04/23 2041 99.6 F (37.6 C)     Temp src --      SpO2 07/04/23 2041 96 %     Weight 07/04/23 2041 190 lb (86.2 kg)     Height 07/04/23 2041 5\' 6"  (1.676 m)   Constitutional: Alert and oriented. Well appearing and in no acute distress. Eyes: Conjunctivae are normal.  Head: Atraumatic. Nose: No congestion/rhinnorhea. Mouth/Throat: Mucous membranes are moist.   Neck: No stridor. Cardiovascular: Normal rate, regular rhythm. Good peripheral circulation. Grossly normal heart sounds.   Respiratory: Normal respiratory effort.  No retractions. Lungs CTAB. Gastrointestinal: Soft and nontender. No distention.  Musculoskeletal: No lower extremity tenderness nor edema. No gross deformities of extremities. Neurologic:   Normal speech and language. No gross focal neurologic deficits are appreciated.  Skin:  Skin is warm, dry and intact. No rash noted.  ____________________________________________   LABS (all labs ordered are listed, but only abnormal results are displayed)  Labs Reviewed  CBC WITH DIFFERENTIAL/PLATELET - Abnormal; Notable for the following components:      Result Value   WBC 11.4 (*)    Neutro Abs 9.0 (*)    All other components within normal limits  COMPREHENSIVE METABOLIC PANEL - Abnormal; Notable for the following components:   Glucose, Bld 162 (*)    AST 44 (*)    ALT 71 (*)    Alkaline Phosphatase 127 (*)    All other components within normal limits  URINALYSIS, ROUTINE W REFLEX MICROSCOPIC - Abnormal; Notable for the following components:   Color, Urine STRAW (*)    APPearance HAZY (*)    Hgb urine dipstick MODERATE (*)    Leukocytes,Ua SMALL (*)    All other components within normal limits  URINALYSIS, MICROSCOPIC (REFLEX) - Abnormal; Notable for the following components:   Bacteria, UA FEW (*)    All other components within normal limits   ____________________________________________  RADIOLOGY  CT Renal Stone Study  Result Date: 07/05/2023 CLINICAL DATA:  Abdominal pain, flank pain EXAM: CT ABDOMEN AND PELVIS WITHOUT CONTRAST TECHNIQUE: Multidetector CT imaging of the abdomen and pelvis was performed following the standard protocol without IV contrast. RADIATION DOSE REDUCTION: This exam was performed according to the departmental dose-optimization program which includes automated exposure control, adjustment of the  mA and/or kV according to patient size and/or use of iterative reconstruction technique. COMPARISON:  None Available. FINDINGS: Lower chest: No acute abnormality.  Small hiatal hernia. Hepatobiliary: No focal hepatic abnormality. Gallbladder unremarkable. Pancreas: No focal abnormality or ductal dilatation. Spleen: No focal abnormality.  Normal size.  Adrenals/Urinary Tract: No adrenal abnormality. No focal renal abnormality. No stones or hydronephrosis. Urinary bladder is unremarkable. Stomach/Bowel: Normal appendix. Prior gastric sleeve. Stomach, large and small bowel grossly unremarkable. Vascular/Lymphatic: No evidence of aneurysm or adenopathy. Reproductive: Uterus and adnexa unremarkable.  No mass. Other: No free fluid or free air. Musculoskeletal: No acute bony abnormality. IMPRESSION: Small hiatal hernia.  Prior gastric sleeve. No renal or ureteral stones.  No hydronephrosis. No acute findings. Electronically Signed   By: Charlett Nose M.D.   On: 07/05/2023 03:27    ____________________________________________   PROCEDURES  Procedure(s) performed:   Procedures  None  ____________________________________________   INITIAL IMPRESSION / ASSESSMENT AND PLAN / ED COURSE  Pertinent labs & imaging results that were available during my care of the patient were reviewed by me and considered in my medical decision making (see chart for details).   This patient is Presenting for Evaluation of abdominal pain, which does require a range of treatment options, and is a complaint that involves a high risk of morbidity and mortality.  The Differential Diagnoses includes but is not exclusive to ovarian cyst, ovarian torsion, acute appendicitis, urinary tract infection, endometriosis, bowel obstruction, hernia, colitis, renal colic, gastroenteritis, volvulus etc.   Critical Interventions-    Medications  phenazopyridine (PYRIDIUM) tablet 100 mg (100 mg Oral Given 07/04/23 2232)  ibuprofen (ADVIL) tablet 800 mg (800 mg Oral Given 07/04/23 2232)  cephALEXin (KEFLEX) capsule 500 mg (500 mg Oral Given 07/05/23 0338)    Reassessment after intervention: symptoms improved.    I did obtain Additional Historical Information from family at bedside.   Clinical Laboratory Tests Ordered, included.  Few bacteria with 21-50 WBCs and small leukocytes.   Nitrate negative.  No AKI.  Radiologic Tests Ordered, included CT renal. I independently interpreted the images and agree with radiology interpretation.   Cardiac Monitor Tracing which shows NSR.    Social Determinants of Health Risk patient is a non-smoker.   Medical Decision Making: Summary:  Patient presents to the ED with suprapubic pain and dysuria. Plan to treat for UTI. CT pending. Lower suspicion for pyelonephritis.   02:05 AM  Called radiology regarding the delay in reading CT. They will move patient to the front of the que.   Reevaluation with update and discussion with patient and family. CT without acute process. Plan for UTI treatment and PCP follow up.   Patient's presentation is most consistent with acute presentation with potential threat to life or bodily function.   Disposition: discharge  ____________________________________________  FINAL CLINICAL IMPRESSION(S) / ED DIAGNOSES  Final diagnoses:  Dysuria  Lower abdominal pain     NEW OUTPATIENT MEDICATIONS STARTED DURING THIS VISIT:  Discharge Medication List as of 07/05/2023  3:35 AM     START taking these medications   Details  cephALEXin (KEFLEX) 500 MG capsule Take 1 capsule (500 mg total) by mouth 2 (two) times daily for 7 days., Starting Thu 07/05/2023, Until Thu 07/12/2023, Normal        Note:  This document was prepared using Dragon voice recognition software and may include unintentional dictation errors.  Alona Bene, MD, [facility]    Tanna Loeffler, Arlyss Repress, MD 07/05/23 [phone]

## 2024-04-24 ENCOUNTER — Ambulatory Visit (INDEPENDENT_AMBULATORY_CARE_PROVIDER_SITE_OTHER): Admitting: Obstetrics and Gynecology

## 2024-04-24 ENCOUNTER — Encounter: Payer: Self-pay | Admitting: Obstetrics and Gynecology

## 2024-04-24 VITALS — BP 130/64 | HR 70 | Ht 61.81 in | Wt 193.0 lb

## 2024-04-24 DIAGNOSIS — B3731 Acute candidiasis of vulva and vagina: Secondary | ICD-10-CM

## 2024-04-24 DIAGNOSIS — N952 Postmenopausal atrophic vaginitis: Secondary | ICD-10-CM

## 2024-04-24 DIAGNOSIS — Z01419 Encounter for gynecological examination (general) (routine) without abnormal findings: Secondary | ICD-10-CM

## 2024-04-24 DIAGNOSIS — N898 Other specified noninflammatory disorders of vagina: Secondary | ICD-10-CM

## 2024-04-24 DIAGNOSIS — Z1231 Encounter for screening mammogram for malignant neoplasm of breast: Secondary | ICD-10-CM

## 2024-04-24 DIAGNOSIS — E2839 Other primary ovarian failure: Secondary | ICD-10-CM

## 2024-04-24 MED ORDER — INTRAROSA 6.5 MG VA INST: 1.0000 | VAGINAL_INSERT | Freq: Every evening | VAGINAL | 12 refills | Status: AC | PRN

## 2024-04-24 MED ORDER — FLUCONAZOLE 150 MG PO TABS: 150.0000 mg | ORAL_TABLET | Freq: Once | ORAL | 5 refills | Status: AC

## 2024-04-24 NOTE — Progress Notes (Signed)
 58 y.o. y.o. female here for vaginal itching x 2 years off and on. Reports sugars were recently borderline increased with PMD this year Reports she went to urgent care for this in the past and found she had yeast. She is not on HRT. No PMB  Patient's last menstrual period was 01/29/2017.     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 History of LEEP at [facility] Mammo.-10/26/23 Colonoscopy-07/30/20 [facility]: to get baseline  All dates found in care everywhere  Patient currently demonstrating today  Past medical history,surgical history, family history and social history were all reviewed and documented in the EPIC chart.     Last menstrual period 01/29/2017.  No results found for: DIAGPAP, HPVHIGH, ADEQPAP  GYN HISTORY: No results found for: DIAGPAP, HPVHIGH, ADEQPAP  OB History  Gravida Para Term Preterm AB Living  1 1 1   1   SAB IAB Ectopic Multiple Live Births      1    # Outcome Date GA Lbr Len/2nd Weight Sex Type Anes PTL Lv  1 Term     M CS-Unspec   LIV    Past Medical History:  Diagnosis Date   Elevated LFTs    History of chicken pox    Morbid obesity (HCC)    Seasonal allergies    Vertigo     Past Surgical History:  Procedure Laterality Date   CESAREAN SECTION     x1   GASTRIC BYPASS     HERNIA REPAIR     INTRAUTERINE DEVICE INSERTION  09/18/2000   PARAGUARD T-380   OVARIAN CYST REMOVAL     PELVIC LAPAROSCOPY      Current Outpatient Medications on File Prior to Visit  Medication Sig Dispense Refill   cetirizine (ZYRTEC) 10 MG tablet Take 10 mg by mouth as needed (itching).     levonorgestrel (MIRENA) 20 MCG/24HR IUD 1 each by Intrauterine route once.     No current facility-administered medications on file prior to visit.    Social History   Socioeconomic History   Marital status: Married    Spouse name: Not on file   Number of children: 1    Years of education: Not on file   Highest education level: Not on file  Occupational History   Occupation: Housekeeping    Employer: [employer]  Tobacco Use   Smoking status: Never   Smokeless tobacco: Never  Vaping Use   Vaping status: Never Used  Substance and Sexual Activity   Alcohol use: Yes    Alcohol/week: 0.0 standard drinks of alcohol    Comment: occasional beer   Drug use: No   Sexual activity: Yes    Partners: Male    Birth control/protection: I.U.D.    Comment: MIRENA  Other Topics Concern   Not on file  Social History Narrative   Her adult son lives in [place]. She is from [place], came to the [place]  in [place].   Lives w/ ex-husband     Social Drivers of Health   Financial Resource Strain: Not on file  Food Insecurity: Low Risk  (01/01/2024)   Received from [facility]   Hunger Vital Sign    Within the past 12 months, you worried that your food would run out before you got money to buy more: Never true    Within the past 12 months, the food you bought just didn't last and you didn't have  money to get more. : Never true  Transportation Needs: No Transportation Needs (01/01/2024)   Received from [facility]    In the past 12 months, has lack of reliable transportation kept you from medical appointments, meetings, work or from getting things needed for daily living? : No  Physical Activity: Not on file  Stress: Not on file  Social Connections: Not on file  Intimate Partner Violence: Not on file    Family History  Problem Relation Age of Onset   Diabetes Mother        DIAGNOSED IN 2005   Hypertension Mother    Heart attack Mother    Prostatitis Father    Colon cancer Neg Hx    Breast cancer Neg Hx      No Known Allergies    Patient's last menstrual period was Patient's last menstrual period was 01/29/2017.SABRA            Review of Systems Alls systems reviewed and are negative.     OBGyn Exam  SVE: d/w yeast. Nu swab  collected   A:        vaginal yeast infection, atrophic vaginitis                             P:       diflucan  sent for yeast Nuswab sent Intrarosa  for vaginal dryness dyspareunia   No follow-ups on file.  Almarie MARLA Carpen

## 2024-04-25 LAB — SURESWAB® ADVANCED VAGINITIS PLUS,TMA
C. trachomatis RNA, TMA: NOT DETECTED
CANDIDA SPECIES: NOT DETECTED
Candida glabrata: NOT DETECTED
N. gonorrhoeae RNA, TMA: NOT DETECTED
SURESWAB(R) ADV BACTERIAL VAGINOSIS(BV),TMA: POSITIVE — AB
TRICHOMONAS VAGINALIS (TV),TMA: NOT DETECTED

## 2024-04-26 ENCOUNTER — Ambulatory Visit: Payer: Self-pay | Admitting: Obstetrics and Gynecology

## 2024-04-28 ENCOUNTER — Other Ambulatory Visit: Payer: Self-pay

## 2024-04-28 MED ORDER — METRONIDAZOLE 500 MG PO TABS: 500.0000 mg | ORAL_TABLET | Freq: Two times a day (BID) | ORAL | 0 refills | Status: DC

## 2024-06-11 ENCOUNTER — Encounter: Payer: Self-pay | Admitting: Obstetrics and Gynecology

## 2024-06-11 ENCOUNTER — Ambulatory Visit: Admitting: Obstetrics and Gynecology

## 2024-06-11 VITALS — BP 128/70 | HR 73 | Ht 62.21 in | Wt 190.8 lb

## 2024-06-11 DIAGNOSIS — N898 Other specified noninflammatory disorders of vagina: Secondary | ICD-10-CM

## 2024-06-11 DIAGNOSIS — E0800 Diabetes mellitus due to underlying condition with hyperosmolarity without nonketotic hyperglycemic-hyperosmolar coma (NKHHC): Secondary | ICD-10-CM

## 2024-06-11 DIAGNOSIS — E2839 Other primary ovarian failure: Secondary | ICD-10-CM | POA: Diagnosis not present

## 2024-06-11 NOTE — Patient Instructions (Signed)
 Locations you can schedule your bone scan are:  The [facility] bone scan location scheduling line is [phone]  [facility] is [phone]  [facility] radiology [phone]   [facility] [phone]  [facility]: [phone]    Another option if they are behind is [facility] and their number is [phone].  I can send the referral if you want to go there.    Please let me know if you have any trouble scheduling. This is important for management of osteoporosis or osteopenia.   I can sit down with you after the scan to discuss results and treatment.  Dr. Glennon

## 2024-06-11 NOTE — Progress Notes (Signed)
   Acute Office Visit  Subjective:    Patient ID: Jasmine Campbell, female    DOB: 1965/12/15, 58 y.o.   MRN: [id number]   HPI 58 y.o. presents today for Gynecologic Exam (Reck a BV  and talk about intrarosa , she said it is working well for her. She did have some itching yesterday. ) .Intrarosa  is helping and is using it every other day.  Patient's last menstrual period was 01/29/2017.    Review of Systems     Objective:    OBGyn Exam  BP 128/70   Pulse 73   Ht 5' 2.21 (1.58 m)   Wt 190 lb 12.8 oz (86.5 kg)   LMP 01/29/2017   SpO2 95%   BMI 34.67 kg/m  Wt Readings from Last 3 Encounters:  06/11/24 190 lb 12.8 oz (86.5 kg)  04/24/24 193 lb (87.5 kg)  07/04/23 190 lb (86.2 kg)        Erica, CMA was present for the exam Sve: possible yeast infection, no lesions  Assessment & Plan:  Vaginal discharge, pruritus Nu swab sent. To notify patient of the results Patient states she has not been scheduled or called for her bone density. New referral placed.  Almarie MARLA Carpen

## 2024-06-12 LAB — SURESWAB® ADVANCED VAGINITIS PLUS,TMA
C. trachomatis RNA, TMA: NOT DETECTED
CANDIDA SPECIES: NOT DETECTED
Candida glabrata: NOT DETECTED
N. gonorrhoeae RNA, TMA: NOT DETECTED
SURESWAB(R) ADV BACTERIAL VAGINOSIS(BV),TMA: POSITIVE — AB
TRICHOMONAS VAGINALIS (TV),TMA: NOT DETECTED

## 2024-06-14 ENCOUNTER — Ambulatory Visit: Payer: Self-pay | Admitting: Obstetrics and Gynecology

## 2024-06-16 MED ORDER — METRONIDAZOLE 500 MG PO TABS
500.0000 mg | ORAL_TABLET | Freq: Two times a day (BID) | ORAL | 0 refills | Status: AC
Start: 2024-06-16 — End: 2024-06-23

## 2024-08-13 ENCOUNTER — Ambulatory Visit (HOSPITAL_BASED_OUTPATIENT_CLINIC_OR_DEPARTMENT_OTHER)
Admission: RE | Admit: 2024-08-13 | Discharge: 2024-08-13 | Disposition: A | Discharge status: Home / Self Care | Source: Ambulatory Visit | Attending: Obstetrics and Gynecology | Admitting: Obstetrics and Gynecology

## 2024-08-13 DIAGNOSIS — E2839 Other primary ovarian failure: Secondary | ICD-10-CM | POA: Insufficient documentation
